# Patient Record
Sex: Male | Born: 1990 | Race: Black or African American | Hispanic: No | Marital: Single | Smoking: Current every day smoker
Health system: Southern US, Community
[De-identification: ages and names within clinical notes are randomized; demographics above are authoritative.]

## PROBLEM LIST (undated history)

## (undated) DIAGNOSIS — R634 Abnormal weight loss: Secondary | ICD-10-CM

## (undated) DIAGNOSIS — E079 Disorder of thyroid, unspecified: Secondary | ICD-10-CM

## (undated) DIAGNOSIS — F419 Anxiety disorder, unspecified: Secondary | ICD-10-CM

## (undated) HISTORY — PX: NO PAST SURGERIES: SHX2092

---
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
## 2011-05-07 LAB — GLUCOSE, CAPILLARY: Glucose-Capillary: 94

## 2011-09-10 ENCOUNTER — Encounter (HOSPITAL_COMMUNITY): Payer: Self-pay | Admitting: *Deleted

## 2011-09-10 ENCOUNTER — Emergency Department (INDEPENDENT_AMBULATORY_CARE_PROVIDER_SITE_OTHER)
Admission: EM | Admit: 2011-09-10 | Discharge: 2011-09-10 | Disposition: A | Discharge status: Home / Self Care | Payer: Self-pay | Source: Home / Self Care | Attending: Family Medicine | Admitting: Family Medicine

## 2011-09-10 DIAGNOSIS — M67929 Unspecified disorder of synovium and tendon, unspecified upper arm: Secondary | ICD-10-CM

## 2011-09-10 DIAGNOSIS — M6790 Unspecified disorder of synovium and tendon, unspecified site: Secondary | ICD-10-CM

## 2011-09-10 LAB — URIC ACID: Uric Acid, Serum: 4.8 mg/dL (ref 4.0–7.8)

## 2011-09-10 MED ORDER — HYDROCODONE-ACETAMINOPHEN 5-325 MG PO TABS: ORAL_TABLET | ORAL | Status: DC

## 2011-09-10 MED ORDER — NAPROXEN 500 MG PO TABS: 500.0000 mg | ORAL_TABLET | Freq: Two times a day (BID) | ORAL | Status: AC

## 2011-09-10 NOTE — ED Notes (Signed)
Pt reports left elbow pain x 2 days.  He denies any known injury, but regularly lifts objects up to 25 lbs at work.    Swelling noted above left elbow

## 2011-09-10 NOTE — ED Provider Notes (Signed)
History     CSN: [id number]  Arrival date & time 09/10/11  1234   First MD Initiated Contact with Patient 09/10/11 1503      Chief Complaint  Patient presents with  . Arm Pain    (Consider location/radiation/quality/duration/timing/severity/associated sxs/prior treatment) HPI Comments: Christian Pitts presents for evaluation of sudden onset of left elbow pain on the previous Saturday. He denies any injury to the arm. He states he does a lot of lifting of heavy objects at his workplace. But he has not worked since Thursday. Over the weekend, he was just keeping his little daughter. He denies any history of gout. He denies any other medical problems. He refuses to bend his arm now, secondary to pain. Nor does he completely extend it straight.  Patient is a 21 y.o. male presenting with arm pain. The history is provided by the patient.  Arm Pain This is a new problem. The current episode started more than 2 days ago. The problem occurs constantly. The problem has not changed since onset.The symptoms are aggravated by bending. He has tried nothing for the symptoms.    History reviewed. No pertinent past medical history.  History reviewed. No pertinent past surgical history.  History reviewed. No pertinent family history.  History  Substance Use Topics  . Smoking status: Former Smoker    Quit date: 09/09/2010  . Smokeless tobacco: Not on file  . Alcohol Use: No      Review of Systems  Constitutional: Negative.   HENT: Negative.   Eyes: Negative.   Respiratory: Negative.   Cardiovascular: Negative.   Gastrointestinal: Negative.   Genitourinary: Negative.   Musculoskeletal: Positive for arthralgias. Negative for joint swelling.  Skin: Negative.   Neurological: Negative.     Allergies  Review of patient's allergies indicates no known allergies.  Home Medications   Current Outpatient Rx  Name Route Sig Dispense Refill  . HYDROCODONE-ACETAMINOPHEN 5-325 MG PO TABS  Take one  to two tablets every 4 to 6 hours as needed for pain 10 tablet 0  . NAPROXEN 500 MG PO TABS Oral Take 1 tablet (500 mg total) by mouth 2 (two) times daily. 30 tablet 0    BP 135/68  Pulse 68  Temp(Src) 98.1 F (36.7 C) (Oral)  Resp 18  SpO2 100%  Physical Exam  Nursing note and vitals reviewed. Constitutional: He is oriented to person, place, and time. He appears well-developed and well-nourished.  HENT:  Head: Normocephalic and atraumatic.  Eyes: EOM are normal.  Neck: Normal range of motion.  Pulmonary/Chest: Effort normal.  Musculoskeletal:       Left elbow: He exhibits decreased range of motion. He exhibits no swelling, no effusion, no deformity and no laceration. tenderness found. Olecranon process tenderness noted.       Arms: Neurological: He is alert and oriented to person, place, and time.  Skin: Skin is warm and dry.  Psychiatric: His behavior is normal.    ED Course  Procedures (including critical care time)   Labs Reviewed  URIC ACID   No results found.   1. Tendinopathy of elbow       MDM  Given rx for naproxen 500 mg PO BID and hydrocodone PRN; re-evaluate in 48 hours        Richardo Priest, MD 09/10/11 1656

## 2011-09-12 ENCOUNTER — Encounter (HOSPITAL_COMMUNITY): Payer: Self-pay | Admitting: Emergency Medicine

## 2011-09-12 ENCOUNTER — Emergency Department (INDEPENDENT_AMBULATORY_CARE_PROVIDER_SITE_OTHER)
Admission: EM | Admit: 2011-09-12 | Discharge: 2011-09-12 | Disposition: A | Discharge status: Home / Self Care | Payer: Self-pay | Source: Home / Self Care

## 2011-09-12 DIAGNOSIS — M25529 Pain in unspecified elbow: Secondary | ICD-10-CM

## 2011-09-12 DIAGNOSIS — M25522 Pain in left elbow: Secondary | ICD-10-CM

## 2011-09-12 MED ORDER — TRAMADOL HCL 50 MG PO TABS: 50.0000 mg | ORAL_TABLET | Freq: Four times a day (QID) | ORAL | Status: AC | PRN

## 2011-09-12 NOTE — ED Notes (Signed)
Patient reports continued pain in left elbow.  Patient reports a decrease in swelling and able to bend elbow more than initially.  Patient reports he continues to have pain.  Denies injury.  Onset of pain started Saturday.  Seen Tuesday at the urgent care .

## 2011-09-12 NOTE — ED Provider Notes (Signed)
History     CSN: [id number]  Arrival date & time 09/12/11  1423   None     Chief Complaint  Patient presents with  . Elbow Pain    (Consider location/radiation/quality/duration/timing/severity/associated sxs/prior treatment) HPI Comments: Pt presents today for recheck of Lt elbow discomfort. He states that overall the elbow is improving. He is noticing less pain and swelling, but continues to have discomfort and is worse with flexion and lifting. He is taking Naprosyn as prescribed and hydrocodone for pain as needed but has run out. He has had no injury. His job is physical requiring heavy lifting. He did not go to work again today, is scheduled off tomorrow, then is to work Sat and Sunday.     History reviewed. No pertinent past medical history.  History reviewed. No pertinent past surgical history.  History reviewed. No pertinent family history.  History  Substance Use Topics  . Smoking status: Former Smoker    Quit date: 09/09/2010  . Smokeless tobacco: Not on file  . Alcohol Use: No      Review of Systems  Constitutional: Negative for fever and chills.  Musculoskeletal: Positive for joint swelling.  Skin: Negative for color change and rash.  Neurological: Negative for numbness.    Allergies  Review of patient's allergies indicates no known allergies.  Home Medications   Current Outpatient Rx  Name Route Sig Dispense Refill  . NAPROXEN 500 MG PO TABS Oral Take 1 tablet (500 mg total) by mouth 2 (two) times daily. 30 tablet 0  . TRAMADOL HCL 50 MG PO TABS Oral Take 1 tablet (50 mg total) by mouth every 6 (six) hours as needed for pain. 12 tablet 0    BP 156/80  Pulse 82  Temp(Src) 98.5 F (36.9 C) (Oral)  Resp 18  SpO2 99%  Physical Exam  Nursing note and vitals reviewed. Constitutional: He appears well-developed and well-nourished. No distress.  HENT:  Head: Normocephalic and atraumatic.  Cardiovascular:  Pulses:      Radial pulses are 2+ on the  left side.  Musculoskeletal:       Left elbow: He exhibits decreased range of motion (decreased flexion to 90 degrees and extension to 160 degrees) and swelling (dorsal elbow, generalized, not localized to olecranon bursa). He exhibits no effusion, no deformity and no laceration. tenderness (TTP superior to olecranon process) found. No radial head, no medial epicondyle, no lateral epicondyle and no olecranon process tenderness noted.  Neurological: He is alert.  Skin: Skin is warm and dry. No erythema.  Psychiatric: He has a normal mood and affect.    ED Course  Procedures (including critical care time)  Labs Reviewed - No data to display No results found.   1. Left elbow pain       MDM  Pt returned for 48 hr recheck of Lt elbow pain with some improvement but pain and swelling persist. Advised pt to continue current treatment plan. Begin icing elbow. If symptoms persist in one week will need ortho f/u.         Melody Comas, [place] 09/12/11 [phone]

## 2011-09-14 NOTE — ED Provider Notes (Signed)
Medical screening examination/treatment/procedure(s) were performed by non-physician practitioner and as supervising physician I was immediately available for consultation/collaboration.  Luiz Blare MD   Luiz Blare, MD 09/14/11 [phone]

## 2013-03-03 ENCOUNTER — Encounter (HOSPITAL_COMMUNITY): Payer: Self-pay | Admitting: *Deleted

## 2013-03-03 ENCOUNTER — Emergency Department (HOSPITAL_COMMUNITY): Payer: Self-pay

## 2013-03-03 ENCOUNTER — Emergency Department (HOSPITAL_COMMUNITY)
Admission: EM | Admit: 2013-03-03 | Discharge: 2013-03-04 | Disposition: A | Discharge status: Home / Self Care | Payer: Self-pay | Attending: Emergency Medicine | Admitting: Emergency Medicine

## 2013-03-03 DIAGNOSIS — F411 Generalized anxiety disorder: Secondary | ICD-10-CM | POA: Insufficient documentation

## 2013-03-03 DIAGNOSIS — R0789 Other chest pain: Secondary | ICD-10-CM | POA: Insufficient documentation

## 2013-03-03 DIAGNOSIS — Z87891 Personal history of nicotine dependence: Secondary | ICD-10-CM | POA: Insufficient documentation

## 2013-03-03 DIAGNOSIS — R61 Generalized hyperhidrosis: Secondary | ICD-10-CM | POA: Insufficient documentation

## 2013-03-03 DIAGNOSIS — R51 Headache: Secondary | ICD-10-CM | POA: Insufficient documentation

## 2013-03-03 DIAGNOSIS — R0602 Shortness of breath: Secondary | ICD-10-CM | POA: Insufficient documentation

## 2013-03-03 LAB — URINALYSIS, ROUTINE W REFLEX MICROSCOPIC
Glucose, UA: NEGATIVE mg/dL
Hgb urine dipstick: NEGATIVE
Ketones, ur: NEGATIVE mg/dL
Protein, ur: NEGATIVE mg/dL
Urobilinogen, UA: 1 mg/dL (ref 0.0–1.0)

## 2013-03-03 LAB — POCT I-STAT TROPONIN I: Troponin i, poc: 0.01 ng/mL (ref 0.00–0.08)

## 2013-03-03 LAB — CBC WITH DIFFERENTIAL/PLATELET
Eosinophils Absolute: 0.1 10*3/uL (ref 0.0–0.7)
Eosinophils Relative: 1 % (ref 0–5)
Lymphs Abs: 1.9 10*3/uL (ref 0.7–4.0)
MCH: 24.3 pg — ABNORMAL LOW (ref 26.0–34.0)
MCHC: 33.4 g/dL (ref 30.0–36.0)
MCV: 72.8 fL — ABNORMAL LOW (ref 78.0–100.0)
Monocytes Absolute: 0.6 10*3/uL (ref 0.1–1.0)
Neutrophils Relative %: 52 % (ref 43–77)
Platelets: 197 10*3/uL (ref 150–400)
RBC: 5.3 MIL/uL (ref 4.22–5.81)
RDW: 14 % (ref 11.5–15.5)

## 2013-03-03 LAB — BASIC METABOLIC PANEL
CO2: 27 mEq/L (ref 19–32)
Calcium: 9.4 mg/dL (ref 8.4–10.5)
Creatinine, Ser: 0.66 mg/dL (ref 0.50–1.35)
GFR calc non Af Amer: >90 mL/min (ref >90)

## 2013-03-03 LAB — RAPID URINE DRUG SCREEN, HOSP PERFORMED
Amphetamines: NOT DETECTED
Tetrahydrocannabinol: POSITIVE — AB

## 2013-03-03 MED ORDER — ASPIRIN 81 MG PO CHEW
324.0000 mg | CHEWABLE_TABLET | Freq: Once | ORAL | Status: AC
  Administered 2013-03-03: 324 mg via ORAL
  Filled 2013-03-03: qty 4

## 2013-03-03 MED ORDER — MORPHINE SULFATE 4 MG/ML IJ SOLN
4.0000 mg | Freq: Once | INTRAMUSCULAR | Status: AC
  Administered 2013-03-03: 4 mg via INTRAVENOUS
  Filled 2013-03-03: qty 1

## 2013-03-03 MED ORDER — SODIUM CHLORIDE 0.9 % IV BOLUS (SEPSIS)
1000.0000 mL | Freq: Once | INTRAVENOUS | Status: AC
  Administered 2013-03-03: 1000 mL via INTRAVENOUS

## 2013-03-03 MED ORDER — LORAZEPAM 2 MG/ML IJ SOLN
INTRAMUSCULAR | Status: AC
  Filled 2013-03-03: qty 1

## 2013-03-03 MED ORDER — LORAZEPAM 2 MG/ML IJ SOLN
2.0000 mg | Freq: Once | INTRAMUSCULAR | Status: AC
  Administered 2013-03-03: 2 mg via INTRAVENOUS

## 2013-03-03 NOTE — ED Provider Notes (Addendum)
CSN: [id number]     Arrival date & time 03/03/13  1922 History     First MD Initiated Contact with Patient 03/03/13 1948     Chief Complaint  Patient presents with  . Chest Pain   (Consider location/radiation/quality/duration/timing/severity/associated sxs/prior Treatment) Patient is a 22 y.o. male presenting with chest pain. The history is provided by the patient.  Chest Pain Pain location:  Substernal area Pain quality: tightness   Pain radiates to:  Does not radiate Pain severity:  Severe Onset quality:  Sudden Duration:  1 hour Timing:  Constant Progression:  Unchanged Chronicity:  New Context comment:  Smoked a cigarette, then went to lay down, then CP started.   Relieved by:  Nothing Worsened by:  Nothing tried Associated symptoms: diaphoresis, headache and shortness of breath   Associated symptoms: no abdominal pain, no cough, no fever, no nausea and not vomiting   Associated symptoms comment:  Generalized jerking   History reviewed. No pertinent past medical history. History reviewed. No pertinent past surgical history. No family history on file. History  Substance Use Topics  . Smoking status: Former Smoker    Quit date: 09/09/2010  . Smokeless tobacco: Not on file  . Alcohol Use: No    Review of Systems  Constitutional: Positive for diaphoresis. Negative for fever.  HENT: Negative for congestion.   Respiratory: Positive for shortness of breath. Negative for cough.   Cardiovascular: Positive for chest pain.  Gastrointestinal: Negative for nausea, vomiting, abdominal pain and diarrhea.  Neurological: Positive for headaches.  All other systems reviewed and are negative.    Allergies  Review of patient's allergies indicates no known allergies.  Home Medications  No current outpatient prescriptions on file. BP 167/77  Pulse 119  Temp(Src) 98.1 F (36.7 C) (Oral)  Resp 21  SpO2 99% Physical Exam  Nursing note and vitals reviewed. Constitutional: He  is oriented to person, place, and time. He appears well-developed and well-nourished. He appears distressed.  HENT:  Head: Normocephalic and atraumatic.  Mouth/Throat: Oropharynx is clear and moist.  Eyes: Conjunctivae are normal. Pupils are equal, round, and reactive to light. No scleral icterus.  Neck: Neck supple.  Cardiovascular: Regular rhythm, normal heart sounds and intact distal pulses.  Tachycardia present.   No murmur heard. Pulmonary/Chest: Effort normal and breath sounds normal. No stridor. No respiratory distress. He has no wheezes. He has no rales.  Abdominal: Soft. He exhibits no distension. There is no tenderness. There is no rebound and no guarding.  Musculoskeletal: Normal range of motion. He exhibits no edema.  Neurological: He is alert and oriented to person, place, and time.  Generalized jerking movements in all extremities  Skin: Skin is warm. No rash noted. He is diaphoretic.  Psychiatric: He has a normal mood and affect. His behavior is normal.    ED Course   Procedures (including critical care time)  Labs Reviewed  CBC WITH DIFFERENTIAL - Abnormal; Notable for the following:    Hemoglobin 12.9 (*)    HCT 38.6 (*)    MCV 72.8 (*)    MCH 24.3 (*)    All other components within normal limits  URINALYSIS, ROUTINE W REFLEX MICROSCOPIC - Abnormal; Notable for the following:    Color, Urine AMBER (*)    APPearance CLOUDY (*)    All other components within normal limits  URINE RAPID DRUG SCREEN (HOSP PERFORMED) - Abnormal; Notable for the following:    Tetrahydrocannabinol POSITIVE (*)    All other components within  normal limits  BASIC METABOLIC PANEL  POCT I-STAT TROPONIN I  POCT I-STAT TROPONIN I   Dg Chest Port 1 View  03/03/2013   *RADIOLOGY REPORT*  Clinical Data: Pain, headaches, shortness of breath, and dizziness.  PORTABLE CHEST - 1 VIEW  Comparison: 06/10/2007  Findings: Heart size and pulmonary vascularity are normal and the lungs are clear.  No  osseous abnormality.  IMPRESSION: Normal chest.   Original Report Authenticated By: Francene Boyers, M.D.  All radiology studies independently viewed by me.     EKG - Baseline artifact. Sinus tachy, rate 121, normal axis, QTc 496, nonspecific T wave abnormalites in lateral leads, no priors.  EKG #2 - sinus tachy, rate 108, PR 234, normal axis, nonspecific ST/T changes, similar to prior.   1. Anxiety reaction     MDM  22 yo male presenting with chest pain, SOB, diaphoresis, and generalized jerking.  Appears very anxious.  Started shortly after smoking a cigarette which he states was a normal cigarette.  EMS gave ASA.  EKG showed tachycardia with some nonspecific changes with significant artifact.  IV ativan and morphine given.  Will repeat EKG and check labs.  Possibly acute anxiety reaction vs drug ingestion.   9:05 PM Appears much better.  States his pain and SOB have near resolved.  Still slightly tachycardic.  No longer diaphoretic and shaking.    12:07 AM Observed in ED with no return in his symptoms.  States he feels normal now.  Repeat EKG showed some nonspecific changes.  I discussed these with Cardiology who felt that they were most consistent with early repolarization.  He is extremely low risk.  I feel that his symptoms are best explained by an anxiety reaction. He endorses being under increased stress recently, which may have instigated his symptoms.  DC'd home with return precautions and PCP followup.     Candyce Churn, MD 03/04/13 0454  Candyce Churn, MD 03/04/13 [phone]

## 2013-03-03 NOTE — ED Notes (Signed)
PT now sitting up in bed texting on cell phone. Pt calm, no sweating and laughing with laughing with family.

## 2013-03-03 NOTE — ED Notes (Signed)
Pt aware urine is needed. Given a urinal but unable to void at this time.

## 2013-03-03 NOTE — ED Notes (Signed)
Pt reported lying down after smoking a Cig. Tonight and center CP pressure type. CP does not radiate. EMS staff gave 324 ASA and 2 nitro no relief of CP

## 2013-03-03 NOTE — ED Notes (Signed)
EKG was shot and signed by Dr. Loretha Stapler

## 2013-03-03 NOTE — ED Notes (Signed)
On assessment Pt's body ridged and shaking. Pt sweating and denies pain.

## 2013-08-01 ENCOUNTER — Encounter (HOSPITAL_COMMUNITY): Payer: Self-pay | Admitting: Emergency Medicine

## 2013-08-01 ENCOUNTER — Emergency Department (HOSPITAL_COMMUNITY)
Admission: EM | Admit: 2013-08-01 | Discharge: 2013-08-01 | Disposition: A | Discharge status: Home / Self Care | Payer: Self-pay | Attending: Emergency Medicine | Admitting: Emergency Medicine

## 2013-08-01 ENCOUNTER — Other Ambulatory Visit: Payer: Self-pay

## 2013-08-01 DIAGNOSIS — F17200 Nicotine dependence, unspecified, uncomplicated: Secondary | ICD-10-CM | POA: Insufficient documentation

## 2013-08-01 DIAGNOSIS — G479 Sleep disorder, unspecified: Secondary | ICD-10-CM | POA: Insufficient documentation

## 2013-08-01 DIAGNOSIS — F411 Generalized anxiety disorder: Secondary | ICD-10-CM | POA: Insufficient documentation

## 2013-08-01 DIAGNOSIS — R000 Tachycardia, unspecified: Secondary | ICD-10-CM | POA: Insufficient documentation

## 2013-08-01 DIAGNOSIS — R197 Diarrhea, unspecified: Secondary | ICD-10-CM | POA: Insufficient documentation

## 2013-08-01 DIAGNOSIS — R21 Rash and other nonspecific skin eruption: Secondary | ICD-10-CM | POA: Insufficient documentation

## 2013-08-01 HISTORY — DX: Anxiety disorder, unspecified: F41.9

## 2013-08-01 MED ORDER — DIPHENHYDRAMINE HCL 25 MG PO TABS: 25.0000 mg | ORAL_TABLET | Freq: Four times a day (QID) | ORAL | Status: DC

## 2013-08-01 MED ORDER — DIPHENHYDRAMINE HCL 25 MG PO CAPS: 25.0000 mg | ORAL_CAPSULE | Freq: Once | ORAL | Status: DC

## 2013-08-01 NOTE — ED Provider Notes (Signed)
CSN: [id number]     Arrival date & time 08/01/13  1549 History  This chart was scribed for Christian Helper, PA-C, working with Junius Argyle, MD by Blanchard Kelch, ED Scribe. This patient was seen in room TR06C/TR06C and the patient's care was started at 6:22 PM.    Chief Complaint  Patient presents with  . Anxiety  . Rash    Patient is a 22 y.o. male presenting with anxiety and rash. The history is provided by the patient. No language interpreter was used.  Anxiety  Rash Associated symptoms: diarrhea   Associated symptoms: no fever     HPI Comments: Christian Pitts is a 22 y.o. male who presents to the Emergency Department complaining of an intermittent itching rash on his forearms and lower legs that appeared two or three months ago. He first noticed the rash on his right forearm at the site of a tattoo. He denies any contacts at home with similar rash. He also reports recent abdominal discomfort after eating. He has diarrhea after eating for a few months that does not cause the abdominal discomfort to subside. He denies any environmental changes. He denies above baseline flatulence. He also reports having anxiety that began two months ago. He was brought here to the ER because he couldn't stop shaking. He states he was given morphine without relief. He states that his body shakes everyday due to anxiety. He also reports difficulty sleeping for about a year. He denies SI, HI or hallucinations. He is a current smoker. He denies regular alcohol use. He denies illegal drug use. He consumes caffeine about two times a week.  Past Medical History  Diagnosis Date  . Anxiety    History reviewed. No pertinent past surgical history. History reviewed. No pertinent family history. History  Substance Use Topics  . Smoking status: Current Every Day Smoker    Last Attempt to Quit: 09/09/2010  . Smokeless tobacco: Not on file  . Alcohol Use: Yes    Review of Systems  Constitutional: Negative  for fever.  Gastrointestinal: Positive for diarrhea.  Skin: Positive for rash.  Psychiatric/Behavioral: Positive for sleep disturbance. Negative for suicidal ideas. The patient is nervous/anxious.     Allergies  Review of patient's allergies indicates no known allergies.  Home Medications   Current Outpatient Rx  Name  Route  Sig  Dispense  Refill  . dibucaine (NUPERCAINAL) 1 % OINT   Rectal   Place 1 application rectally daily as needed (rash).          Triage Vitals; BP 160/69  Pulse 136  Temp(Src) 98.6 F (37 C) (Oral)  Resp 22  Wt 230 lb (104.327 kg)  SpO2 98%  Physical Exam  Nursing note and vitals reviewed. Constitutional: He is oriented to person, place, and time. He appears well-developed and well-nourished. No distress.  HENT:  Head: Normocephalic and atraumatic.  Eyes: EOM are normal.  Neck: Neck supple. No tracheal deviation present. No thyromegaly present.  Cardiovascular: Tachycardia present.  Exam reveals no gallop and no friction rub.   No murmur heard. Pulmonary/Chest: Effort normal. No respiratory distress. He has no wheezes. He has no rales.  Lungs clear to ascultation bilaterally.   Musculoskeletal: Normal range of motion.  Neurological: He is alert and oriented to person, place, and time.  Skin: Skin is warm and dry. Rash noted.  Few fine papular lesions noted to bilateral forearms and bilateral lower legs without erythema. No pustules, vesicular or petechial lesions noted.  Psychiatric: He has a normal mood and affect. His behavior is normal.    ED Course  Procedures (including critical care time)   Date: 08/01/2013  Rate: 119  Rhythm: sinus tachycardia  QRS Axis: normal  Intervals: normal  ST/T Wave abnormalities: normal  Conduction Disutrbances:none  Narrative Interpretation:   Old EKG Reviewed: none available    DIAGNOSTIC STUDIES: Oxygen Saturation is 98% on room air, normal by my interpretation.    COORDINATION OF CARE: 6:26  PM -Advise patient to follow up with PCP for potential thyroid issues. Will give patient resources to follow up. Patient verbalizes understanding and agrees with treatment plan.  7:00 PM Pt report feeling anxious.  Is tachycardic but no lightheadedness, dizziness.  ECG with sinus tach but without arrythmia.  I suspect thyroid dysfunction but since sxs ongoing for 2 months he will benefit from further management through PCP.  Resources given.  Benadryl for rash since rash without red flags.  Could be contact dermatitis however rash is faint and non concerning. Pt has sxs suggestive of IBS as well however no abd pain and no rectal bleeding concerning for IBD.  I do not think benzo is appropriate in this setting as pt denies panic attack.  Pt agrees to f/u with PCP.  Return precaution as needed.    Labs Review Labs Reviewed - No data to display Imaging Review No results found.  EKG Interpretation   None       MDM   1. Tachycardia   2. Rash    BP 160/69  Pulse 123  Temp(Src) 98.6 F (37 C) (Oral)  Resp 22  Wt 230 lb (104.327 kg)  SpO2 99%   I personally performed the services described in this documentation, which was scribed in my presence. The recorded information has been reviewed and is accurate.     Christian Helper, PA-C 08/01/13 1907

## 2013-08-01 NOTE — ED Notes (Signed)
Pt alert and oriented, with steady gait at time of discharge. Pt given discharge papers and papers explained. All questions answered and pt walked to discharge.  

## 2013-08-01 NOTE — ED Notes (Signed)
Pt c/o increasing anxiety x 3 months that was worse today; pt sts rash to right arm; pt sts feels jittery and seen for same and given meds for anxiety with effect but now out of meds

## 2013-08-01 NOTE — ED Notes (Signed)
Pt denies to pharmacy tech ever being prescribed any medication, hx does not suggest medication prescribed in the past

## 2013-08-02 NOTE — ED Provider Notes (Signed)
Medical screening examination/treatment/procedure(s) were performed by non-physician practitioner and as supervising physician I was immediately available for consultation/collaboration.  Date: 08/02/2013  Rate: 119  Rhythm: sinus tachycardia  QRS Axis: normal  Intervals: normal  ST/T Wave abnormalities: normal  Conduction Disutrbances:none  Narrative Interpretation: sinus tachycardia  Old EKG Reviewed: none available    Junius Argyle, MD 08/02/13 1145

## 2013-09-06 ENCOUNTER — Ambulatory Visit: Payer: Self-pay | Attending: Internal Medicine | Admitting: Internal Medicine

## 2013-09-06 ENCOUNTER — Encounter: Payer: Self-pay | Admitting: Internal Medicine

## 2013-09-06 VITALS — BP 139/78 | HR 134 | Temp 98.3°F | Resp 16 | Ht 73.0 in | Wt 220.0 lb

## 2013-09-06 DIAGNOSIS — F17200 Nicotine dependence, unspecified, uncomplicated: Secondary | ICD-10-CM | POA: Insufficient documentation

## 2013-09-06 DIAGNOSIS — R000 Tachycardia, unspecified: Secondary | ICD-10-CM | POA: Insufficient documentation

## 2013-09-06 LAB — COMPREHENSIVE METABOLIC PANEL
ALBUMIN: 4.2 g/dL (ref 3.5–5.2)
ALK PHOS: 119 U/L — AB (ref 39–117)
ALT: 21 U/L (ref 0–53)
AST: 20 U/L (ref 0–37)
BUN: 11 mg/dL (ref 6–23)
CALCIUM: 10.6 mg/dL — AB (ref 8.4–10.5)
CHLORIDE: 102 meq/L (ref 96–112)
CO2: 30 mEq/L (ref 19–32)
Creat: 0.5 mg/dL (ref 0.50–1.35)
Glucose, Bld: 101 mg/dL — ABNORMAL HIGH (ref 70–99)
POTASSIUM: 4.6 meq/L (ref 3.5–5.3)
SODIUM: 140 meq/L (ref 135–145)
TOTAL PROTEIN: 6.3 g/dL (ref 6.0–8.3)
Total Bilirubin: 0.7 mg/dL (ref 0.2–1.2)

## 2013-09-06 LAB — CBC WITH DIFFERENTIAL/PLATELET
BASOS ABS: 0 10*3/uL (ref 0.0–0.1)
BASOS PCT: 0 % (ref 0–1)
EOS ABS: 0.1 10*3/uL (ref 0.0–0.7)
Eosinophils Relative: 1 % (ref 0–5)
HCT: 40 % (ref 39.0–52.0)
Hemoglobin: 13.3 g/dL (ref 13.0–17.0)
Lymphocytes Relative: 32 % (ref 12–46)
Lymphs Abs: 2.3 10*3/uL (ref 0.7–4.0)
MCH: 23.4 pg — AB (ref 26.0–34.0)
MCHC: 33.3 g/dL (ref 30.0–36.0)
MCV: 70.4 fL — ABNORMAL LOW (ref 78.0–100.0)
MONOS PCT: 17 % — AB (ref 3–12)
Monocytes Absolute: 1.2 10*3/uL — ABNORMAL HIGH (ref 0.1–1.0)
NEUTROS ABS: 3.5 10*3/uL (ref 1.7–7.7)
NEUTROS PCT: 50 % (ref 43–77)
Platelets: 222 10*3/uL (ref 150–400)
RBC: 5.68 MIL/uL (ref 4.22–5.81)
RDW: 15.3 % (ref 11.5–15.5)
WBC: 7.1 10*3/uL (ref 4.0–10.5)

## 2013-09-06 LAB — TSH

## 2013-09-06 MED ORDER — METOPROLOL SUCCINATE ER 25 MG PO TB24: 25.0000 mg | ORAL_TABLET | Freq: Two times a day (BID) | ORAL | Status: DC

## 2013-09-06 NOTE — Patient Instructions (Signed)
Hyperthyroidism  The thyroid is a large gland located in the lower front part of your neck. The thyroid helps control metabolism. Metabolism is how your body uses food. It controls metabolism with the hormone thyroxine. When the thyroid is overactive, it produces too much hormone. When this happens, these following problems may occur:   · Nervousness  · Heat intolerance  · Weight loss (in spite of increase food intake)  · Diarrhea  · Change in hair or skin texture  · Palpitations (heart skipping or having extra beats)  · Tachycardia (rapid heart rate)  · Loss of menstruation (amenorrhea)  · Shaking of the hands  CAUSES  · Grave's Disease (the immune system attacks the thyroid gland). This is the most common cause.  · Inflammation of the thyroid gland.  · Tumor (usually benign) in the thyroid gland or elsewhere.  · Excessive use of thyroid medications (both prescription and 'natural').  · Excessive ingestion of Iodine.  DIAGNOSIS   To prove hyperthyroidism, your caregiver may do blood tests and ultrasound tests. Sometimes the signs are hidden. It may be necessary for your caregiver to watch this illness with blood tests, either before or after diagnosis and treatment.  TREATMENT  Short-term treatment  There are several treatments to control symptoms. Drugs called beta blockers may give some relief. Drugs that decrease hormone production will provide temporary relief in many people. These measures will usually not give permanent relief.  Definitive therapy  There are treatments available which can be discussed between you and your caregiver which will permanently treat the problem. These treatments range from surgery (removal of the thyroid), to the use of radioactive iodine (destroys the thyroid by radiation), to the use of antithyroid drugs (interfere with hormone synthesis). The first two treatments are permanent and usually successful. They most often require hormone replacement therapy for life. This is because  it is impossible to remove or destroy the exact amount of thyroid required to make a person euthyroid (normal).  HOME CARE INSTRUCTIONS   See your caregiver if the problems you are being treated for get worse. Examples of this would be the problems listed above.  SEEK MEDICAL CARE IF:  Your general condition worsens.  MAKE SURE YOU:   · Understand these instructions.  · Will watch your condition.  · Will get help right away if you are not doing well or get worse.  Document Released: 07/22/2005 Document Revised: 10/14/2011 Document Reviewed: 12/03/2006  [employer]® Patient Information ©2014 [employer].

## 2013-09-06 NOTE — Progress Notes (Signed)
Patient ID: Christian Pitts, male   DOB: [birth year], 23 y.o.   MRN: [id number]   CC: tachycardic  HPI: Pt is 23 yo male who presents to clinic with main concern of progressively worsening weakness, weigh loss over 80 lbs, found to have fast heart rate. He was treated in emergency department several days ago and was told to followup in primary care clinic for further evaluation. Patient denies chest pain or shortness of breath, no specific abdominal or urinary concerns.  No Known Allergies Past Medical History  Diagnosis Date  . Anxiety    Current Outpatient Prescriptions on File Prior to Visit  Medication Sig Dispense Refill  . dibucaine (NUPERCAINAL) 1 % OINT Place 1 application rectally daily as needed (rash).      . diphenhydrAMINE (BENADRYL) 25 MG tablet Take 1 tablet (25 mg total) by mouth every 6 (six) hours.  20 tablet  0   No current facility-administered medications on file prior to visit.   History reviewed. No pertinent family history. History   Social History  . Marital Status: Single    Spouse Name: N/A    Number of Children: N/A  . Years of Education: N/A   Occupational History  . Not on file.   Social History Main Topics  . Smoking status: Current Every Day Smoker    Last Attempt to Quit: 09/09/2010  . Smokeless tobacco: Not on file  . Alcohol Use: Yes  . Drug Use: No  . Sexual Activity: Not on file   Other Topics Concern  . Not on file   Social History Narrative  . No narrative on file    Review of Systems  Constitutional: Negative for fever, chills, diaphoresis, activity change.  HENT: Negative for ear pain, nosebleeds, congestion, facial swelling, rhinorrhea, neck pain, neck stiffness and ear discharge.   Eyes: Negative for pain, discharge, redness, itching and visual disturbance.  Respiratory: Negative for cough, choking, chest tightness, shortness of breath, wheezing and stridor.   Cardiovascular: Negative for chest pain, palpitations and leg  swelling.  Gastrointestinal: Negative for abdominal distention.  Genitourinary: Negative for dysuria, urgency, frequency, hematuria, flank pain, decreased urine volume, difficulty urinating and dyspareunia.  Musculoskeletal: Negative for back pain, joint swelling, arthralgias and gait problem.  Neurological: Negative for dizziness, tremors, seizures, syncope, facial asymmetry, speech difficulty, weakness, light-headedness, numbness and headaches.  Hematological: Negative for adenopathy. Does not bruise/bleed easily.  Psychiatric/Behavioral: Negative for hallucinations, behavioral problems, confusion, dysphoric mood, decreased concentration and agitation.    Objective:   Filed Vitals:   09/06/13 1008  BP: 139/78  Pulse: 134  Temp: 98.3 F (36.8 C)  Resp: 16    Physical Exam  Constitutional: Appears well-developed and well-nourished. No distress.  HENT: Normocephalic. External right and left ear normal. Oropharynx is clear and moist.  Eyes: Conjunctivae and EOM are normal. PERRLA, no scleral icterus.  bilateral exophthalmus noted Neck: Normal ROM. Neck supple. No JVD. No tracheal deviation. Enlarged thyroid gland, goiter noted, no tenderness on palpation CVS: Regular rhythm, tachycardic, S1/S2 +, no murmurs, no gallops, no carotid bruit.  Pulmonary: Effort and breath sounds normal, no stridor, rhonchi, wheezes, rales.  Abdominal: Soft. BS +,  no distension, tenderness, rebound or guarding.  Musculoskeletal: Normal range of motion. No edema and no tenderness.  Lymphadenopathy: No lymphadenopathy noted, cervical, inguinal. Neuro: Alert. Normal reflexes, muscle tone coordination. No cranial nerve deficit. Skin: Skin is warm and dry. No rash noted. Not diaphoretic. No erythema. No pallor.  Psychiatric: Normal mood and affect.  Behavior, judgment, thought content normal.   Lab Results  Component Value Date   WBC 5.3 03/03/2013   HGB 12.9* 03/03/2013   HCT 38.6* 03/03/2013   MCV 72.8*  03/03/2013   PLT 197 03/03/2013   Lab Results  Component Value Date   CREATININE 0.66 03/03/2013   BUN 9 03/03/2013   NA 142 03/03/2013   K 3.7 03/03/2013   CL 107 03/03/2013   CO2 27 03/03/2013    No results found for this basename: HGBA1C   Lipid Panel  No results found for this basename: chol, trig, hdl, cholhdl, vldl, ldlcalc       Assessment and plan:   Tachycardia - based on physical exam and symptoms this is mostly consistent with over functioning thyroid gland. We'll proceed with checking TSH, will also send patient for a thyroid ultrasound and echocardiogram. I will temporarily start patient on metoprolol 25 mg by mouth twice a day. I also told patient as soon as we get the results back of TSH will call him and we may need to ask him to come back for further evaluation. Patient also may need referral to endocrinologist based on the above results.

## 2013-09-06 NOTE — Progress Notes (Signed)
HFU Pt was admitted to the hospital with SOB, chest pain and tachycardia.

## 2013-09-10 ENCOUNTER — Ambulatory Visit (HOSPITAL_COMMUNITY): Payer: Self-pay | Attending: Internal Medicine

## 2013-09-10 ENCOUNTER — Ambulatory Visit (HOSPITAL_COMMUNITY): Payer: Self-pay

## 2013-10-01 ENCOUNTER — Ambulatory Visit: Payer: Self-pay

## 2013-10-05 ENCOUNTER — Encounter: Payer: Self-pay | Admitting: Emergency Medicine

## 2013-10-05 ENCOUNTER — Other Ambulatory Visit: Payer: Self-pay | Admitting: Emergency Medicine

## 2013-10-05 ENCOUNTER — Encounter: Payer: Self-pay | Admitting: Internal Medicine

## 2013-10-05 DIAGNOSIS — E0590 Thyrotoxicosis, unspecified without thyrotoxic crisis or storm: Secondary | ICD-10-CM

## 2013-10-05 MED ORDER — PROPRANOLOL HCL 10 MG PO TABS: 10.0000 mg | ORAL_TABLET | Freq: Three times a day (TID) | ORAL | Status: DC

## 2013-10-05 MED ORDER — LEVOTHYROXINE SODIUM 50 MCG PO TABS: 50.0000 ug | ORAL_TABLET | Freq: Every day | ORAL | Status: DC

## 2013-10-05 NOTE — Progress Notes (Signed)
Pt here to pick up papers regarding medication taking for tachycardic @ [facility]. Rechecked pt h/r 107,asymptomatic Blood work drawn Pt prescribed Levothyroxine 50 mcg daily,Propranolol 10 mg TID and to return in 2 weeks for nurse visit Endocrinology referral placed

## 2013-10-08 ENCOUNTER — Ambulatory Visit: Payer: Self-pay | Admitting: Endocrinology

## 2013-10-08 DIAGNOSIS — Z0289 Encounter for other administrative examinations: Secondary | ICD-10-CM

## 2013-10-13 ENCOUNTER — Ambulatory Visit: Payer: Self-pay

## 2013-11-10 ENCOUNTER — Ambulatory Visit: Payer: Self-pay

## 2013-11-10 ENCOUNTER — Ambulatory Visit: Payer: Self-pay | Attending: Internal Medicine | Admitting: Internal Medicine

## 2013-11-10 VITALS — BP 153/75 | HR 109 | Temp 98.3°F | Resp 16 | Ht 73.0 in | Wt 207.0 lb

## 2013-11-10 DIAGNOSIS — E0590 Thyrotoxicosis, unspecified without thyrotoxic crisis or storm: Secondary | ICD-10-CM | POA: Insufficient documentation

## 2013-11-10 DIAGNOSIS — F17200 Nicotine dependence, unspecified, uncomplicated: Secondary | ICD-10-CM | POA: Insufficient documentation

## 2013-11-10 DIAGNOSIS — R5383 Other fatigue: Secondary | ICD-10-CM

## 2013-11-10 DIAGNOSIS — R5381 Other malaise: Secondary | ICD-10-CM | POA: Insufficient documentation

## 2013-11-10 DIAGNOSIS — Z79899 Other long term (current) drug therapy: Secondary | ICD-10-CM | POA: Insufficient documentation

## 2013-11-10 DIAGNOSIS — R634 Abnormal weight loss: Secondary | ICD-10-CM | POA: Insufficient documentation

## 2013-11-10 DIAGNOSIS — F411 Generalized anxiety disorder: Secondary | ICD-10-CM | POA: Insufficient documentation

## 2013-11-10 LAB — CBC WITH DIFFERENTIAL/PLATELET
BASOS ABS: 0 10*3/uL (ref 0.0–0.1)
Basophils Relative: 0 % (ref 0–1)
Eosinophils Absolute: 0.1 10*3/uL (ref 0.0–0.7)
Eosinophils Relative: 2 % (ref 0–5)
HEMATOCRIT: 39.9 % (ref 39.0–52.0)
HEMOGLOBIN: 13.2 g/dL (ref 13.0–17.0)
LYMPHS ABS: 2.1 10*3/uL (ref 0.7–4.0)
LYMPHS PCT: 36 % (ref 12–46)
MCH: 23.4 pg — ABNORMAL LOW (ref 26.0–34.0)
MCHC: 33.1 g/dL (ref 30.0–36.0)
MCV: 70.6 fL — AB (ref 78.0–100.0)
MONO ABS: 0.9 10*3/uL (ref 0.1–1.0)
MONOS PCT: 15 % — AB (ref 3–12)
NEUTROS ABS: 2.8 10*3/uL (ref 1.7–7.7)
Neutrophils Relative %: 47 % (ref 43–77)
Platelets: 221 10*3/uL (ref 150–400)
RBC: 5.65 MIL/uL (ref 4.22–5.81)
RDW: 15.7 % — ABNORMAL HIGH (ref 11.5–15.5)
WBC: 5.9 10*3/uL (ref 4.0–10.5)

## 2013-11-10 LAB — COMPREHENSIVE METABOLIC PANEL
ALT: 20 U/L (ref 0–53)
AST: 20 U/L (ref 0–37)
Albumin: 4 g/dL (ref 3.5–5.2)
Alkaline Phosphatase: 136 U/L — ABNORMAL HIGH (ref 39–117)
BUN: 9 mg/dL (ref 6–23)
CO2: 28 meq/L (ref 19–32)
Calcium: 10.7 mg/dL — ABNORMAL HIGH (ref 8.4–10.5)
Chloride: 104 mEq/L (ref 96–112)
Creat: 0.5 mg/dL (ref 0.50–1.35)
GLUCOSE: 94 mg/dL (ref 70–99)
POTASSIUM: 4.7 meq/L (ref 3.5–5.3)
Sodium: 140 mEq/L (ref 135–145)
Total Bilirubin: 0.8 mg/dL (ref 0.2–1.2)
Total Protein: 6.2 g/dL (ref 6.0–8.3)

## 2013-11-10 MED ORDER — METOPROLOL TARTRATE 25 MG PO TABS: 25.0000 mg | ORAL_TABLET | Freq: Two times a day (BID) | ORAL | Status: DC

## 2013-11-10 NOTE — Progress Notes (Signed)
Pt states that for 2 weeks he has been having pain in his right eye. Now his eye is drooping and giving him a headache.

## 2013-11-10 NOTE — Patient Instructions (Signed)
Hyperthyroidism  The thyroid is a large gland located in the lower front part of your neck. The thyroid helps control metabolism. Metabolism is how your body uses food. It controls metabolism with the hormone thyroxine. When the thyroid is overactive, it produces too much hormone. When this happens, these following problems may occur:   · Nervousness  · Heat intolerance  · Weight loss (in spite of increase food intake)  · Diarrhea  · Change in hair or skin texture  · Palpitations (heart skipping or having extra beats)  · Tachycardia (rapid heart rate)  · Loss of menstruation (amenorrhea)  · Shaking of the hands  CAUSES  · Grave's Disease (the immune system attacks the thyroid gland). This is the most common cause.  · Inflammation of the thyroid gland.  · Tumor (usually benign) in the thyroid gland or elsewhere.  · Excessive use of thyroid medications (both prescription and 'natural').  · Excessive ingestion of Iodine.  DIAGNOSIS   To prove hyperthyroidism, your caregiver may do blood tests and ultrasound tests. Sometimes the signs are hidden. It may be necessary for your caregiver to watch this illness with blood tests, either before or after diagnosis and treatment.  TREATMENT  Short-term treatment  There are several treatments to control symptoms. Drugs called beta blockers may give some relief. Drugs that decrease hormone production will provide temporary relief in many people. These measures will usually not give permanent relief.  Definitive therapy  There are treatments available which can be discussed between you and your caregiver which will permanently treat the problem. These treatments range from surgery (removal of the thyroid), to the use of radioactive iodine (destroys the thyroid by radiation), to the use of antithyroid drugs (interfere with hormone synthesis). The first two treatments are permanent and usually successful. They most often require hormone replacement therapy for life. This is because  it is impossible to remove or destroy the exact amount of thyroid required to make a person euthyroid (normal).  HOME CARE INSTRUCTIONS   See your caregiver if the problems you are being treated for get worse. Examples of this would be the problems listed above.  SEEK MEDICAL CARE IF:  Your general condition worsens.  MAKE SURE YOU:   · Understand these instructions.  · Will watch your condition.  · Will get help right away if you are not doing well or get worse.  Document Released: 07/22/2005 Document Revised: 10/14/2011 Document Reviewed: 12/03/2006  [employer]® Patient Information ©2014 [employer].

## 2013-11-10 NOTE — Progress Notes (Signed)
Patient ID: Christian Pitts, male   DOB: [birth year], 23 y.o.   MRN: [id number]  CC: follow up   HPI: Pt is 23 yo male who comes in to the clinic with main concern of persistent fatigue, weight loss of 10 lbs over the past 3 months. Pt reports he was told he has hyperthyroidism and was taking medicine for it but now sure of the name and he ran out of it. He reports no chest pain or shortness of breath, no abdominal or urinary concerns. He is noticing protruding eyes and also enlarged gland around the neck area. He was told he needs to see endocrinologist but has not been able to afford the visitation.   No Known Allergies Past Medical History  Diagnosis Date  . Anxiety    Current Outpatient Prescriptions on File Prior to Visit  Medication Sig Dispense Refill  . dibucaine (NUPERCAINAL) 1 % OINT Place 1 application rectally daily as needed (rash).      . diphenhydrAMINE (BENADRYL) 25 MG tablet Take 1 tablet (25 mg total) by mouth every 6 (six) hours.  20 tablet  0   No current facility-administered medications on file prior to visit.   History reviewed. No pertinent family history. History   Social History  . Marital Status: Single    Spouse Name: N/A    Number of Children: N/A  . Years of Education: N/A   Occupational History  . Not on file.   Social History Main Topics  . Smoking status: Current Every Day Smoker    Last Attempt to Quit: 09/09/2010  . Smokeless tobacco: Not on file  . Alcohol Use: Yes  . Drug Use: No  . Sexual Activity: Not on file   Other Topics Concern  . Not on file   Social History Narrative  . No narrative on file    Review of Systems  Constitutional: Negative for fever, chills HENT: Negative for ear pain, nosebleeds, congestion, facial swelling, rhinorrhea, neck pain, neck stiffness and ear discharge.   Eyes: Negative for pain, discharge, redness, itching and visual disturbance.  Respiratory: Negative for cough, choking, chest tightness, shortness  of breath, wheezing and stridor.   Cardiovascular: Negative for chest pain, palpitations and leg swelling.  Gastrointestinal: Negative for abdominal distention.  Genitourinary: Negative for dysuria, urgency, frequency, hematuria, flank pain, decreased urine volume, difficulty urinating and dyspareunia.  Musculoskeletal: Negative for back pain, joint swelling, arthralgias and gait problem.  Neurological: Negative for syncope, facial asymmetry, speech difficulty, weakness, light-headedness, numbness and headaches.  Hematological: Does not bruise/bleed easily.  Psychiatric/Behavioral: Negative for hallucinations, behavioral problems, confusion, dysphoric mood, decreased concentration and agitation.    Objective:   Filed Vitals:   11/10/13 0902  BP: 153/75  Pulse: 109  Temp: 98.3 F (36.8 C)  Resp: 16    Physical Exam  Constitutional: Appears well-developed and well-nourished. No distress.  HENT: Normocephalic. External right and left ear normal. Oropharynx is clear and moist.  Eyes: Conjunctivae and EOM are normal. PERRLA, no scleral icterus. Exophthalmos very prominent with lid lag bilaterally  Neck: Normal ROM. Neck supple. No JVD. No tracheal deviation. Thyroid enlarged bilaterally with no specific nodules palpated.  CVS: Regular rhythm, tachycardic, S1/S2 +, no murmurs, no gallops, no carotid bruit.  Pulmonary: Effort and breath sounds normal, no stridor, rhonchi, wheezes, rales.  Abdominal: Soft. BS +,  no distension, tenderness, rebound or guarding.  Musculoskeletal: Normal range of motion. No edema and no tenderness.  Lymphadenopathy: No lymphadenopathy noted, cervical, inguinal. Neuro:  Alert. Normal reflexes, muscle tone coordination. No cranial nerve deficit. Skin: Skin is warm and dry. No rash noted. Not diaphoretic. No erythema. No pallor.  Psychiatric: Normal mood and affect. Behavior, judgment, thought content normal.   Lab Results  Component Value Date   WBC 7.1  09/06/2013   HGB 13.3 09/06/2013   HCT 40.0 09/06/2013   MCV 70.4* 09/06/2013   PLT 222 09/06/2013   Lab Results  Component Value Date   CREATININE 0.50 09/06/2013   BUN 11 09/06/2013   NA 140 09/06/2013   K 4.6 09/06/2013   CL 102 09/06/2013   CO2 30 09/06/2013    No results found for this basename: HGBA1C   Lipid Panel  No results found for this basename: chol, trig, hdl, cholhdl, vldl, ldlcalc       Assessment and plan:   Hyperthyroidism - will repeat TSH today and will ask for T4 as well - spoke with Dr Sharl MaKerr endocrinologist for further recommendations - thyroid uptake scan ordered stat - pt provided script for metoprolol for now and will start methimazole once thyroid uptake done - per Dr. Sharl MaKerr thyroid uptake must be done at least one week off methimazole but pt has never been on methimazole so we can do the scan first  - also per Dr. Sharl MaKerr recommend starting Methimazole if lover function stable, 60 mg PO QD  - will follow up on results  - pt to come back in one week

## 2013-11-11 LAB — T4: T4, Total: 26.6 ug/dL — ABNORMAL HIGH (ref 5.0–12.5)

## 2013-11-15 LAB — THYROID STIMULATING IMMUNOGLOBULIN: TSI: 551 %{baseline} — AB (ref <140)

## 2013-11-16 ENCOUNTER — Other Ambulatory Visit: Payer: Self-pay | Admitting: Internal Medicine

## 2013-11-16 ENCOUNTER — Telehealth: Payer: Self-pay | Admitting: Emergency Medicine

## 2013-11-16 NOTE — Telephone Encounter (Signed)
Spoke with pt in regards to lab results and referral to Endocrinology Pt states he needs to complete orange card information Pt needs scheduled thyroid scan and medication. Will route message to Dr. Izola Pricemyers

## 2013-11-17 ENCOUNTER — Telehealth: Payer: Self-pay | Admitting: Emergency Medicine

## 2013-11-17 DIAGNOSIS — E0590 Thyrotoxicosis, unspecified without thyrotoxic crisis or storm: Secondary | ICD-10-CM

## 2013-11-17 NOTE — Telephone Encounter (Signed)
Spoke with pt and instructed him to make sure he keeps his scheduled U/S appt and Thyroid Scan Uptake Sched[facility] 1st floor Radiology 4/21 1245 pm and 4/23-4/24 scheduled scan @ 1245 pm Pt verbalized understanding and also told to go directly to ER if develop CP,increased tremors

## 2013-11-23 ENCOUNTER — Ambulatory Visit (HOSPITAL_COMMUNITY): Admission: RE | Admit: 2013-11-23 | Payer: Self-pay | Source: Ambulatory Visit

## 2013-11-23 ENCOUNTER — Telehealth: Payer: Self-pay | Admitting: Internal Medicine

## 2013-11-23 NOTE — Telephone Encounter (Signed)
Pt. Needs documentation regarding medication taken and explanation of reason why pt. is unable to work.Marland Kitchen.Marland Kitchen.Please contact pt.

## 2013-11-25 ENCOUNTER — Ambulatory Visit (HOSPITAL_COMMUNITY): Payer: Self-pay

## 2013-11-26 ENCOUNTER — Encounter (HOSPITAL_COMMUNITY): Payer: Self-pay

## 2014-02-10 ENCOUNTER — Observation Stay (HOSPITAL_COMMUNITY)
Admission: EM | Admit: 2014-02-10 | Discharge: 2014-02-11 | Disposition: A | Discharge status: Home / Self Care | Payer: Self-pay | Attending: Internal Medicine | Admitting: Internal Medicine

## 2014-02-10 ENCOUNTER — Encounter (HOSPITAL_COMMUNITY): Payer: Self-pay | Admitting: Emergency Medicine

## 2014-02-10 DIAGNOSIS — F17200 Nicotine dependence, unspecified, uncomplicated: Secondary | ICD-10-CM | POA: Insufficient documentation

## 2014-02-10 DIAGNOSIS — I440 Atrioventricular block, first degree: Secondary | ICD-10-CM | POA: Insufficient documentation

## 2014-02-10 DIAGNOSIS — R000 Tachycardia, unspecified: Secondary | ICD-10-CM | POA: Diagnosis present

## 2014-02-10 DIAGNOSIS — E0590 Thyrotoxicosis, unspecified without thyrotoxic crisis or storm: Principal | ICD-10-CM | POA: Diagnosis present

## 2014-02-10 DIAGNOSIS — I498 Other specified cardiac arrhythmias: Secondary | ICD-10-CM | POA: Insufficient documentation

## 2014-02-10 HISTORY — DX: Abnormal weight loss: R63.4

## 2014-02-10 HISTORY — DX: Disorder of thyroid, unspecified: E07.9

## 2014-02-10 LAB — BASIC METABOLIC PANEL
Anion gap: 12 (ref 5–15)
BUN: 11 mg/dL (ref 6–23)
CALCIUM: 10.1 mg/dL (ref 8.4–10.5)
CHLORIDE: 102 meq/L (ref 96–112)
CO2: 24 meq/L (ref 19–32)
Creatinine, Ser: 0.5 mg/dL (ref 0.50–1.35)
GFR calc Af Amer: >90 mL/min (ref >90)
GFR calc non Af Amer: >90 mL/min (ref >90)
GLUCOSE: 107 mg/dL — AB (ref 70–99)
Potassium: 4.4 mEq/L (ref 3.7–5.3)
SODIUM: 138 meq/L (ref 137–147)

## 2014-02-10 LAB — CBC
HCT: 37.1 % — ABNORMAL LOW (ref 39.0–52.0)
HEMOGLOBIN: 12.4 g/dL — AB (ref 13.0–17.0)
MCH: 24 pg — AB (ref 26.0–34.0)
MCHC: 33.4 g/dL (ref 30.0–36.0)
MCV: 71.9 fL — ABNORMAL LOW (ref 78.0–100.0)
Platelets: 196 10*3/uL (ref 150–400)
RBC: 5.16 MIL/uL (ref 4.22–5.81)
RDW: 15.2 % (ref 11.5–15.5)
WBC: 4.4 10*3/uL (ref 4.0–10.5)

## 2014-02-10 LAB — HEMOGLOBIN A1C
HEMOGLOBIN A1C: 5.2 % (ref <5.7)
Mean Plasma Glucose: 103 mg/dL (ref <117)

## 2014-02-10 LAB — TSH: TSH: 0.009 u[IU]/mL — AB (ref 0.350–4.500)

## 2014-02-10 LAB — T4, FREE: Free T4: 6.57 ng/dL — ABNORMAL HIGH (ref 0.80–1.80)

## 2014-02-10 MED ORDER — ACETAMINOPHEN 650 MG RE SUPP: 650.0000 mg | Freq: Four times a day (QID) | RECTAL | Status: DC | PRN

## 2014-02-10 MED ORDER — METHIMAZOLE 5 MG PO TABS
5.0000 mg | ORAL_TABLET | Freq: Every day | ORAL | Status: DC
  Administered 2014-02-11: 5 mg via ORAL
  Filled 2014-02-10 (×2): qty 1

## 2014-02-10 MED ORDER — LORAZEPAM 2 MG/ML IJ SOLN
1.0000 mg | Freq: Once | INTRAMUSCULAR | Status: AC
  Administered 2014-02-10: 1 mg via INTRAVENOUS
  Filled 2014-02-10: qty 1

## 2014-02-10 MED ORDER — ONDANSETRON HCL 4 MG PO TABS: 4.0000 mg | ORAL_TABLET | Freq: Four times a day (QID) | ORAL | Status: DC | PRN

## 2014-02-10 MED ORDER — HYDROCODONE-ACETAMINOPHEN 5-325 MG PO TABS
1.0000 | ORAL_TABLET | ORAL | Status: DC | PRN
  Administered 2014-02-11: 2 via ORAL
  Filled 2014-02-10: qty 2

## 2014-02-10 MED ORDER — SODIUM CHLORIDE 0.9 % IV SOLN
1000.0000 mL | Freq: Once | INTRAVENOUS | Status: AC
  Administered 2014-02-10: 1000 mL via INTRAVENOUS

## 2014-02-10 MED ORDER — SODIUM CHLORIDE 0.9 % IJ SOLN
3.0000 mL | Freq: Two times a day (BID) | INTRAMUSCULAR | Status: DC
  Administered 2014-02-10: 3 mL via INTRAVENOUS

## 2014-02-10 MED ORDER — PROPRANOLOL HCL ER 60 MG PO CP24
60.0000 mg | ORAL_CAPSULE | Freq: Every day | ORAL | Status: DC
  Administered 2014-02-11: 60 mg via ORAL
  Filled 2014-02-10 (×2): qty 1

## 2014-02-10 MED ORDER — ACETAMINOPHEN 325 MG PO TABS: 650.0000 mg | ORAL_TABLET | Freq: Four times a day (QID) | ORAL | Status: DC | PRN

## 2014-02-10 MED ORDER — SODIUM CHLORIDE 0.9 % IV SOLN
1000.0000 mL | INTRAVENOUS | Status: DC
  Administered 2014-02-10 – 2014-02-11 (×2): 1000 mL via INTRAVENOUS

## 2014-02-10 MED ORDER — SODIUM CHLORIDE 0.9 % IV SOLN
INTRAVENOUS | Status: AC
  Administered 2014-02-10: 16:00:00 via INTRAVENOUS

## 2014-02-10 MED ORDER — ONDANSETRON HCL 4 MG/2ML IJ SOLN: 4.0000 mg | Freq: Four times a day (QID) | INTRAMUSCULAR | Status: DC | PRN

## 2014-02-10 NOTE — ED Notes (Addendum)
Pt reports generalized body aches x 2 months. Pt stated that he has a thyroid problem but was advised not to take prescribed by Dr. Lenise ArenaMeyers. Pt is currently not taking medications. Reports 120 lb weight loss over 1 year Stated that he occasional feels his heart race

## 2014-02-10 NOTE — Progress Notes (Signed)
Discussed admission status with Dr. Devine. 

## 2014-02-10 NOTE — ED Provider Notes (Signed)
CSN: [id number]     Arrival date & time 02/10/14  1113 History   First MD Initiated Contact with Patient 02/10/14 1241     Chief Complaint  Patient presents with  . Generalized Body Aches    2 months c/o discomfort      HPI Patient has a diagnosis of hyperparathyroidism.  His TSH was nearly undetectable back in February.  He was scheduled to do a ultrasound of his thyroid as well as an uptake scan however the patient seems to have been lost to followup.  It sounds that there are social issues regarding his orange card.  The patient wants treatment and wants to get better but is having difficulty managing the outpatient setting.  He presents the emergency department today because of increasing fatigue and generalized body aches.  He states he lost approximately 120 pounds over the past year.  He denies nausea or vomiting.  No urinary complaints.  No chest pain or shortness of breath.  Tachycardic on arrival   Past Medical History  Diagnosis Date  . Anxiety   . Weight decrease   . Thyroid disorder    History reviewed. No pertinent past surgical history. Family History  Problem Relation Age of Onset  . Thyroid disease Father   . Hypertension Father    History  Substance Use Topics  . Smoking status: Current Every Day Smoker  . Smokeless tobacco: Not on file  . Alcohol Use: Yes     Comment: occasionallty    Review of Systems  All other systems reviewed and are negative.     Allergies  Review of patient's allergies indicates no known allergies.  Home Medications   Prior to Admission medications   Not on File   BP 145/72  Pulse 107  Temp(Src) 97.8 F (36.6 C)  Resp 14  SpO2 100% Physical Exam  Nursing note and vitals reviewed. Constitutional: He is oriented to person, place, and time. He appears well-developed and well-nourished.  HENT:  Head: Normocephalic and atraumatic.  Eyes: EOM are normal.  Neck: Normal range of motion.  Cardiovascular: Normal rate, regular  rhythm, normal heart sounds and intact distal pulses.   Pulmonary/Chest: Effort normal and breath sounds normal. No respiratory distress.  Abdominal: Soft. He exhibits no distension. There is no tenderness.  Musculoskeletal: Normal range of motion.  Neurological: He is alert and oriented to person, place, and time.  Skin: Skin is warm and dry.  Psychiatric: He has a normal mood and affect. Judgment normal.    ED Course  Procedures (including critical care time) Labs Review Labs Reviewed  CBC - Abnormal; Notable for the following:    Hemoglobin 12.4 (*)    HCT 37.1 (*)    MCV 71.9 (*)    MCH 24.0 (*)    All other components within normal limits  BASIC METABOLIC PANEL - Abnormal; Notable for the following:    Glucose, Bld 107 (*)    All other components within normal limits  TSH  T4, FREE    Imaging Review No results found.   EKG Interpretation   Date/Time:  Thursday February 10 2014 12:57:22 EDT Ventricular Rate:  110 PR Interval:    QRS Duration: 97 QT Interval:  320 QTC Calculation: 433 R Axis:   99 Text Interpretation:  sinus tachycardia with first degree AV block  Borderline right axis deviation Borderline T abnormalities, diffuse leads  Borderline ST elevation, anterior leads Confirmed by Jorgen Wolfinger  MD, Czar Ysaguirre  ([id number]) on 02/10/2014 2:47:04  PM      MDM   Final diagnoses:  Hyperthyroidism   Patient be admitted for hyperthyroidism.  I think you'll benefit from inpatient workup and expedient and management of his hyperthyroid symptoms which been present for a very long time.    Lyanne CoKevin M Korine Winton, MD 02/10/14 [phone]

## 2014-02-10 NOTE — ED Notes (Signed)
Initial Contact - pt resting on stretcher with family at bedside, pt reports hx hyperthyroid and reports his complaints are chronic, however pt reports "i've been working and it's bad at work".  Pt reports feeling palpitations, feeling pain "from my head down", dizzy, nausea, SOB, n/t to extremities.  Skin PWD.  MAEI, ambulatory with steady gait.  ST noted on monitor, pt denies CP.  A+Ox4.  NAD.

## 2014-02-10 NOTE — Progress Notes (Signed)
[facility] CL provided pt with a list of primary care resources and [facility] [employer] [facility]. Patient has had several apt for [facility] but has been a "no show." Patient stated that enrolling into the program was a lot of work and you had to go to a lot of places to get paperwork. CL encouraged patient to follow through with [facility] and establish a pcp.

## 2014-02-10 NOTE — Progress Notes (Addendum)
Changed to obs status. [facility]

## 2014-02-10 NOTE — H&P (Signed)
[facility] History and Physical  Christian Pitts ZOX:[id number] DOB: [birth year] DOA: 02/10/2014  Referring physician: ER physician PCP: Doris Cheadle, MD   Chief Complaint: palpitations   HPI:  23 year old male with past medical history of hyperthyroidism, was seen in [facility] but was lost to follow up who now presents to [facility] ED 02/10/2014 with worsening fatigue, weakness, palpitations ongoing for some time now. No chest pain, no shortness of breath. No abdominal pain, no nausea or vomiting. No fevers or chills. No reports of blood in stool or urine.   In ED, BP was 133/89, HR 106-118, T max 97.9 F and O2 saturation 100%. Blood work was essentially unremarkable. The 12 lead EKG showed sinus tachycardia. He was admitted for further observation and evaluation of tachycardia.  Assessment & Plan    Active Problems:   Hyperthyroidism - TSH 0.009 on this admission - started propranolol 60 mg daily and methimazole 5 mg daily - the 12 lead EKG showed sinus tachycardia - monitor on telemetry - needs endo referral on discharge    Tachycardia, sinus - secondary to hyperthyroidism - started propranolol   DVT prophylaxis:  SCD's bialterally   Radiological Exams on Admission: No results found.  EKG: sinus tachycardia   Code Status: Full Family Communication: Plan of care discussed with the patient  Disposition Plan: Admit for further evaluation; observation status, telemetry unit   Manson Passey, MD  [facility] Pager [phone]  Review of Systems:  Constitutional: Negative for fever, chills and malaise/fatigue. Negative for diaphoresis.  HENT: Negative for hearing loss, ear pain, nosebleeds, congestion, sore throat, neck pain, tinnitus and ear discharge.   Eyes: Negative for blurred vision, double vision, photophobia, pain, discharge and redness.  Respiratory: Negative for cough, hemoptysis, sputum production, shortness of breath, wheezing and  stridor.   Cardiovascular: per HPI.  Gastrointestinal: Negative for nausea, vomiting and abdominal pain. Negative for heartburn, constipation, blood in stool and melena.  Genitourinary: Negative for dysuria, urgency, frequency, hematuria and flank pain.  Musculoskeletal: Negative for myalgias, back pain, joint pain and falls.  Skin: Negative for itching and rash.  Neurological: Negative for dizziness and weakness. Negative for tingling, tremors, sensory change, speech change, focal weakness, loss of consciousness and headaches.  Endo/Heme/Allergies: Negative for environmental allergies and polydipsia. Does not bruise/bleed easily.  Psychiatric/Behavioral: Negative for suicidal ideas. The patient is not nervous/anxious.      Past Medical History  Diagnosis Date  . Anxiety   . Weight decrease   . Thyroid disorder    History reviewed. No pertinent past surgical history. Social History:  reports that he has been smoking.  He does not have any smokeless tobacco history on file. He reports that he drinks alcohol. He reports that he does not use illicit drugs.  No Known Allergies  Family History:  Family History  Problem Relation Age of Onset  . Thyroid disease Father   . Hypertension Father      Prior to Admission medications   Not on File   Physical Exam: Filed Vitals:   02/10/14 1212 02/10/14 1446  BP: 155/77 145/72  Pulse: 118 107  Temp: 97.8 F (36.6 C)   Resp: 20 14  SpO2: 100% 100%    Physical Exam  Constitutional: Appears well-developed and well-nourished. No distress.  HENT: Normocephalic. No tonsillar erythema or exudates Eyes: Conjunctivae and EOM are normal. PERRLA, no scleral icterus.  Neck: Normal ROM. Neck supple. No JVD. No tracheal deviation. No thyromegaly.  CVS:  tachycardia, S1/S2 +, no murmurs, no gallops, no carotid bruit.  Pulmonary: Effort and breath sounds normal, no stridor, rhonchi, wheezes, rales.  Abdominal: Soft. BS +,  no distension,  tenderness, rebound or guarding.  Musculoskeletal: Normal range of motion. No edema and no tenderness.  Lymphadenopathy: No lymphadenopathy noted, cervical, inguinal. Neuro: Alert. Normal reflexes, muscle tone coordination. No focal neurologic deficits. Skin: Skin is warm and dry. No rash noted. Not diaphoretic. No erythema. No pallor.  Psychiatric: Normal mood and affect. Behavior, judgment, thought content normal.   Labs on Admission:  Basic Metabolic Panel:  Recent Labs Lab 02/10/14 1341  NA 138  K 4.4  CL 102  CO2 24  GLUCOSE 107*  BUN 11  CREATININE 0.50  CALCIUM 10.1   Liver Function Tests: No results found for this basename: AST, ALT, ALKPHOS, BILITOT, PROT, ALBUMIN,  in the last 168 hours No results found for this basename: LIPASE, AMYLASE,  in the last 168 hours No results found for this basename: AMMONIA,  in the last 168 hours CBC:  Recent Labs Lab 02/10/14 1341  WBC 4.4  HGB 12.4*  HCT 37.1*  MCV 71.9*  PLT 196   Cardiac Enzymes: No results found for this basename: CKTOTAL, CKMB, CKMBINDEX, TROPONINI,  in the last 168 hours BNP: No components found with this basename: POCBNP,  CBG: No results found for this basename: GLUCAP,  in the last 168 hours  If 7PM-7AM, please contact night-coverage [email or web address] Password [email or web address] 02/10/2014, 3:09 PM

## 2014-02-11 LAB — COMPREHENSIVE METABOLIC PANEL
ALK PHOS: 148 U/L — AB (ref 39–117)
ALT: 18 U/L (ref 0–53)
ANION GAP: 10 (ref 5–15)
AST: 18 U/L (ref 0–37)
Albumin: 3.4 g/dL — ABNORMAL LOW (ref 3.5–5.2)
BUN: 8 mg/dL (ref 6–23)
CALCIUM: 10 mg/dL (ref 8.4–10.5)
CO2: 27 mEq/L (ref 19–32)
Chloride: 103 mEq/L (ref 96–112)
Creatinine, Ser: 0.55 mg/dL (ref 0.50–1.35)
GFR calc Af Amer: >90 mL/min (ref >90)
GFR calc non Af Amer: >90 mL/min (ref >90)
Glucose, Bld: 87 mg/dL (ref 70–99)
Potassium: 4.3 mEq/L (ref 3.7–5.3)
Sodium: 140 mEq/L (ref 137–147)
TOTAL PROTEIN: 5.8 g/dL — AB (ref 6.0–8.3)
Total Bilirubin: 0.6 mg/dL (ref 0.3–1.2)

## 2014-02-11 LAB — CBC
HEMATOCRIT: 36.4 % — AB (ref 39.0–52.0)
HEMOGLOBIN: 11.8 g/dL — AB (ref 13.0–17.0)
MCH: 23.8 pg — ABNORMAL LOW (ref 26.0–34.0)
MCHC: 32.4 g/dL (ref 30.0–36.0)
MCV: 73.5 fL — ABNORMAL LOW (ref 78.0–100.0)
Platelets: 162 10*3/uL (ref 150–400)
RBC: 4.95 MIL/uL (ref 4.22–5.81)
RDW: 15.2 % (ref 11.5–15.5)
WBC: 5.2 10*3/uL (ref 4.0–10.5)

## 2014-02-11 LAB — GLUCOSE, CAPILLARY: Glucose-Capillary: 130 mg/dL — ABNORMAL HIGH (ref 70–99)

## 2014-02-11 MED ORDER — ZOLPIDEM TARTRATE 5 MG PO TABS
5.0000 mg | ORAL_TABLET | Freq: Every evening | ORAL | Status: DC | PRN
  Administered 2014-02-11: 5 mg via ORAL
  Filled 2014-02-11: qty 1

## 2014-02-11 MED ORDER — ATENOLOL 50 MG PO TABS: 50.0000 mg | ORAL_TABLET | Freq: Every day | ORAL | Status: DC

## 2014-02-11 MED ORDER — METHIMAZOLE 10 MG PO TABS: 10.0000 mg | ORAL_TABLET | Freq: Three times a day (TID) | ORAL | Status: DC

## 2014-02-11 NOTE — Progress Notes (Signed)
UR Completed Alvah Gilder Graves-Bigelow, RN,BSN [phone]  

## 2014-02-11 NOTE — Care Management Note (Addendum)
    Page 1 of 2   02/11/2014     2:47:51 PM CARE MANAGEMENT NOTE 02/11/2014  Patient:  Christian Pitts,Christian Pitts   Account Number:  [id number]  Date Initiated:  02/11/2014  Documentation initiated by:  Christian Pitts,Christian Pitts  Subjective/Objective Assessment:   23 Y/O M ADMITTED W/HYPERTHYROIDISM.     Action/Plan:   [facility].HAS NOT GONE TO PCP FOR F/U.   Anticipated DC Date:  02/11/2014   Anticipated DC Plan:  HOME/SELF CARE  In-house referral  Financial Counselor      DC Planning Services  Medication Assistance  [facility]igent [facility]      Choice offered to / List presented to:             Status of service:  Completed, signed off Medicare Important Message given?   (If response is "NO", the following Medicare IM given date fields will be blank) Date Medicare IM given:   Medicare IM given by:   Date Additional Medicare IM given:   Additional Medicare IM given by:    Discharge Disposition:  HOME/SELF CARE  Per UR Regulation:  Reviewed for med. necessity/level of care/duration of stay  If discussed at Long Length of Stay Meetings, dates discussed:    Comments:  02/11/14 Christian Pitts Christian Pitts 2:45P-CALLED PATIENT TO CONFIRM HE WENT TO [facility],Christian Pitts(GIRLFRIEND) ANSWERED PHONE,STATES HE DIDN'T GO TO THE [facility] Christian Pitts,HE IS WAITING ON HIS DAD TO GIVE HIM THE MONEY,REMINDED THAT CLINIC CLOSES @ 6P TODAY,OPEN M-F 10A-6P,ALSO REMINDED THAT HE MUST WALK IN M-F 10A-6P FOR PCP F/U VISIT.SHE VOICED UNDERSTANDING.Christian Pitts W/UPDATE.ALSO UPDATED Christian Pitts @ [facility] SPOKE TO Christian Pitts.UPDATED Christian Pitts.  12:15P-RECEIVED CALL FOR MED ASST.PATIENT DECLINED MATCH PROGRAM,STATES SINCE MED COST $14 HE CAN AFFORD.PATIENT WILL F/U @ [facility], WILL GO THERE @ [place] TODAY FOR MED ASST SPOKE TO Christian Pitts(Christian Pitts) COST TO PATIENT $14,PATIENT STATES HE  CAN AFFORD & WILL GO [facility] @ [facility].NO MATCH PROGRAM USED.PATIENT WILL ALSO F/U FOR PCP THERE, INFORMED TO WALK IN M-F 10A-6P.PATIENT VOICED  UNDERSTANDING.PATIENT CONTACT INFO Christian Pitts#[phone].

## 2014-02-11 NOTE — Progress Notes (Signed)
Nutrition Brief Note  Patient identified on the Malnutrition Screening Tool (MST) Report  Wt Readings from Last 15 Encounters:  02/11/14 213 lb 13.5 oz (97 kg)  11/10/13 207 lb (93.895 kg)  09/06/13 220 lb (99.791 kg)  08/01/13 230 lb (104.327 kg)    Body mass index is 28.22 kg/(m^2). Patient meets criteria for Overweight based on current BMI.   Current diet order is Heart Healthy, patient is consuming approximately 100% of meals at this time. Labs and medications reviewed.   Pt reported an unintentional wt loss of 120 lbs in past year d/t hyperthyroidism; however it is unclear to how reliable information is as pt has lost 17 lbs in past 6-7 months (7.3% body weight loss, non-severe for time frame) per previous medical records. Appetite has been excellent, consumes three meals/day and snacks frequent. No signs of muscle wasting or subcutaneous fat loss on nutrition focused exam. Encouraged heart healthy proteins with balanced meals and snack for weight maintenance.   No nutrition interventions warranted at this time. If nutrition issues arise, please consult RD.   Lloyd HugerSarah F Denya Buckingham MS RD LDN Clinical Dietitian [phone]

## 2014-02-11 NOTE — Discharge Instructions (Signed)
Please follow up with endocrinology in 1 week

## 2014-02-11 NOTE — Progress Notes (Signed)
Patient had PIV removed as per patient's request.  RN x 2 tried to restart PIV. Unsuccessful.  RN to page IV team for restart.

## 2014-02-11 NOTE — Progress Notes (Signed)
Discharge instructions explained using teach back. Prescriptions given. [facility] notified of patient request for meds assist. Stable for discharge.

## 2014-02-11 NOTE — Discharge Summary (Signed)
Physician Discharge Summary  Christian MourningChristopher Pitts [id number]:[id number] DOB: 03/07/1991 DOA: 02/10/2014  PCP: Doris CheadleADVANI, DEEPAK, MD  Admit date: 02/10/2014 Discharge date: 02/11/2014  Time spent: >35 minutes  Recommendations for Outpatient Follow-up:  F/u with endocrinologist  doctor in 1 week  F/u with PCP in 1 week  Discharge Diagnoses:  Active Problems:   Hyperthyroidism   Tachycardia   Discharge Condition: stable   Diet recommendation: regular   Filed Weights   02/10/14 1612 02/11/14 0426  Weight: 98.431 kg (217 lb) 97 kg (213 lb 13.5 oz)    History of present illness:  23 year old male with past medical history of hyperthyroidism, was [facility] but was lost to follow up who [facility] with worsening fatigue, weakness, palpitations ongoing for some time now. No chest pain, no shortness of breath. No abdominal pain, no nausea or vomiting. No fevers or chills. No reports of blood in stool or urine.    Hospital Course:  Hyperthyroidism, TSH -0.009; Free T4-6.57 -tachycardia improved on BB; started methimazole, cont BB; d/w patient, his wife recommended to f/u with endocrinologist in 1 week; likely needs rad iodine ablation   Procedures:  none (i.e. Studies not automatically included, echos, thoracentesis, etc; not x-rays)  Consultations:  none  Discharge Exam: Filed Vitals:   02/11/14 0426  BP: 127/55  Pulse: 96  Temp: 98 F (36.7 C)  Resp: 20    General: alert Cardiovascular: s1,s2 rrr Respiratory: CTA BL  Discharge Instructions  Discharge Instructions   Diet - low sodium heart healthy    Complete by:  As directed      Discharge instructions    Complete by:  As directed   Please follow up with endocrinologist in 1 week     Increase activity slowly    Complete by:  As directed             Medication List         atenolol 50 MG tablet  Commonly known as:  TENORMIN  Take 1 tablet (50 mg total) by mouth  daily.     methimazole 10 MG tablet  Commonly known as:  TAPAZOLE  Take 1 tablet (10 mg total) by mouth 3 (three) times daily.       No Known Allergies     Follow-up Information   Follow up with [facility]. Schedule an appointment as soon as possible for a visit in 1 week.   Contact information:   [address] [place] [id number]        The results of significant diagnostics from this hospitalization (including imaging, microbiology, ancillary and laboratory) are listed below for reference.    Significant Diagnostic Studies: No results found.  Microbiology: No results found for this or any previous visit (from the past 240 hour(s)).   Labs: Basic Metabolic Panel:  Recent Labs Lab 02/10/14 1341 02/11/14 0435  NA 138 140  K 4.4 4.3  CL 102 103  CO2 24 27  GLUCOSE 107* 87  BUN 11 8  CREATININE 0.50 0.55  CALCIUM 10.1 10.0   Liver Function Tests:  Recent Labs Lab 02/11/14 0435  AST 18  ALT 18  ALKPHOS 148*  BILITOT 0.6  PROT 5.8*  ALBUMIN 3.4*   No results found for this basename: LIPASE, AMYLASE,  in the last 168 hours No results found for this basename: AMMONIA,  in the last 168 hours CBC:  Recent Labs Lab 02/10/14  1341 02/11/14 0435  WBC 4.4 5.2  HGB 12.4* 11.8*  HCT 37.1* 36.4*  MCV 71.9* 73.5*  PLT 196 162   Cardiac Enzymes: No results found for this basename: CKTOTAL, CKMB, CKMBINDEX, TROPONINI,  in the last 168 hours BNP: BNP (last 3 results) No results found for this basename: PROBNP,  in the last 8760 hours CBG:  Recent Labs Lab 02/11/14 0757  GLUCAP 130*       Signed:  Jonette Mate N  [facility] 02/11/2014, 11:45 AM

## 2014-04-19 ENCOUNTER — Encounter (HOSPITAL_COMMUNITY): Payer: Self-pay | Admitting: Emergency Medicine

## 2014-04-19 ENCOUNTER — Inpatient Hospital Stay (HOSPITAL_COMMUNITY)
Admission: EM | Admit: 2014-04-19 | Discharge: 2014-04-21 | DRG: 645 | Disposition: A | Discharge status: Home / Self Care | Attending: Internal Medicine | Admitting: Internal Medicine

## 2014-04-19 ENCOUNTER — Emergency Department (HOSPITAL_COMMUNITY)

## 2014-04-19 DIAGNOSIS — E0590 Thyrotoxicosis, unspecified without thyrotoxic crisis or storm: Secondary | ICD-10-CM | POA: Diagnosis not present

## 2014-04-19 DIAGNOSIS — F17200 Nicotine dependence, unspecified, uncomplicated: Secondary | ICD-10-CM | POA: Diagnosis present

## 2014-04-19 DIAGNOSIS — I498 Other specified cardiac arrhythmias: Secondary | ICD-10-CM | POA: Diagnosis present

## 2014-04-19 DIAGNOSIS — R000 Tachycardia, unspecified: Secondary | ICD-10-CM

## 2014-04-19 DIAGNOSIS — Z91199 Patient's noncompliance with other medical treatment and regimen due to unspecified reason: Secondary | ICD-10-CM

## 2014-04-19 DIAGNOSIS — R079 Chest pain, unspecified: Secondary | ICD-10-CM | POA: Diagnosis present

## 2014-04-19 DIAGNOSIS — Z9119 Patient's noncompliance with other medical treatment and regimen: Secondary | ICD-10-CM

## 2014-04-19 DIAGNOSIS — Z23 Encounter for immunization: Secondary | ICD-10-CM

## 2014-04-19 LAB — I-STAT TROPONIN, ED: TROPONIN I, POC: 0 ng/mL (ref 0.00–0.08)

## 2014-04-19 MED ORDER — NITROGLYCERIN 0.4 MG SL SUBL
0.4000 mg | SUBLINGUAL_TABLET | SUBLINGUAL | Status: DC | PRN
  Administered 2014-04-19 (×3): 0.4 mg via SUBLINGUAL
  Filled 2014-04-19 (×2): qty 1

## 2014-04-19 MED ORDER — METOPROLOL TARTRATE 1 MG/ML IV SOLN
5.0000 mg | Freq: Once | INTRAVENOUS | Status: AC
  Administered 2014-04-19: 5 mg via INTRAVENOUS
  Filled 2014-04-19: qty 5

## 2014-04-19 MED ORDER — LORAZEPAM 2 MG/ML IJ SOLN
1.0000 mg | Freq: Once | INTRAMUSCULAR | Status: AC
  Administered 2014-04-19: 1 mg via INTRAVENOUS
  Filled 2014-04-19: qty 1

## 2014-04-19 NOTE — ED Notes (Signed)
EMS called to [facility].  Found patient with complaints of panic attack. Patient has a history of panic attack.  Patient complains of a 7 of 10 chest pain. Patient received  of ASA.  of zofran.

## 2014-04-19 NOTE — ED Notes (Signed)
Bed: ZO10 Expected date: 04/19/14 Expected time: 10:39 PM Means of arrival: Ambulance Comments: Chest pain from jail

## 2014-04-20 ENCOUNTER — Encounter (HOSPITAL_COMMUNITY): Payer: Self-pay | Admitting: Internal Medicine

## 2014-04-20 DIAGNOSIS — F17200 Nicotine dependence, unspecified, uncomplicated: Secondary | ICD-10-CM | POA: Diagnosis present

## 2014-04-20 DIAGNOSIS — E0590 Thyrotoxicosis, unspecified without thyrotoxic crisis or storm: Secondary | ICD-10-CM | POA: Diagnosis present

## 2014-04-20 DIAGNOSIS — I498 Other specified cardiac arrhythmias: Secondary | ICD-10-CM | POA: Diagnosis present

## 2014-04-20 DIAGNOSIS — R079 Chest pain, unspecified: Secondary | ICD-10-CM | POA: Diagnosis present

## 2014-04-20 DIAGNOSIS — Z91199 Patient's noncompliance with other medical treatment and regimen due to unspecified reason: Secondary | ICD-10-CM | POA: Diagnosis not present

## 2014-04-20 DIAGNOSIS — R072 Precordial pain: Secondary | ICD-10-CM

## 2014-04-20 DIAGNOSIS — Z23 Encounter for immunization: Secondary | ICD-10-CM | POA: Diagnosis not present

## 2014-04-20 LAB — CBC WITH DIFFERENTIAL/PLATELET
BASOS PCT: 0 % (ref 0–1)
Basophils Absolute: 0 10*3/uL (ref 0.0–0.1)
Basophils Absolute: 0 10*3/uL (ref 0.0–0.1)
Basophils Relative: 0 % (ref 0–1)
EOS ABS: 0.1 10*3/uL (ref 0.0–0.7)
EOS PCT: 0 % (ref 0–5)
Eosinophils Absolute: 0 10*3/uL (ref 0.0–0.7)
Eosinophils Relative: 1 % (ref 0–5)
HCT: 40.1 % (ref 39.0–52.0)
HEMATOCRIT: 41.6 % (ref 39.0–52.0)
HEMOGLOBIN: 13.7 g/dL (ref 13.0–17.0)
HEMOGLOBIN: 13.3 g/dL (ref 13.0–17.0)
LYMPHS ABS: 2.8 10*3/uL (ref 0.7–4.0)
LYMPHS PCT: 38 % (ref 12–46)
Lymphocytes Relative: 44 % (ref 12–46)
Lymphs Abs: 3.6 10*3/uL (ref 0.7–4.0)
MCH: 24 pg — ABNORMAL LOW (ref 26.0–34.0)
MCH: 24 pg — AB (ref 26.0–34.0)
MCHC: 33.2 g/dL (ref 30.0–36.0)
MCHC: 32.9 g/dL (ref 30.0–36.0)
MCV: 73 fL — ABNORMAL LOW (ref 78.0–100.0)
MCV: 72.4 fL — AB (ref 78.0–100.0)
MONO ABS: 1.3 10*3/uL — AB (ref 0.1–1.0)
MONOS PCT: 16 % — AB (ref 3–12)
Monocytes Absolute: 1.2 10*3/uL — ABNORMAL HIGH (ref 0.1–1.0)
Monocytes Relative: 15 % — ABNORMAL HIGH (ref 3–12)
NEUTROS ABS: 3.4 10*3/uL (ref 1.7–7.7)
Neutro Abs: 3.3 10*3/uL (ref 1.7–7.7)
Neutrophils Relative %: 46 % (ref 43–77)
Neutrophils Relative %: 40 % — ABNORMAL LOW (ref 43–77)
Platelets: 202 10*3/uL (ref 150–400)
Platelets: 201 10*3/uL (ref 150–400)
RBC: 5.54 MIL/uL (ref 4.22–5.81)
RBC: 5.7 MIL/uL (ref 4.22–5.81)
RDW: 14.6 % (ref 11.5–15.5)
RDW: 14.7 % (ref 11.5–15.5)
WBC: 7.3 10*3/uL (ref 4.0–10.5)
WBC: 8.4 10*3/uL (ref 4.0–10.5)

## 2014-04-20 LAB — BASIC METABOLIC PANEL
Anion gap: 14 (ref 5–15)
BUN: 11 mg/dL (ref 6–23)
CO2: 26 meq/L (ref 19–32)
Calcium: 10 mg/dL (ref 8.4–10.5)
Chloride: 97 mEq/L (ref 96–112)
Creatinine, Ser: 0.59 mg/dL (ref 0.50–1.35)
GFR calc Af Amer: >90 mL/min (ref >90)
GFR calc non Af Amer: >90 mL/min (ref >90)
Glucose, Bld: 99 mg/dL (ref 70–99)
POTASSIUM: 3.7 meq/L (ref 3.7–5.3)
Sodium: 137 mEq/L (ref 137–147)

## 2014-04-20 LAB — URINALYSIS, ROUTINE W REFLEX MICROSCOPIC
BILIRUBIN URINE: NEGATIVE
Glucose, UA: NEGATIVE mg/dL
HGB URINE DIPSTICK: NEGATIVE
Ketones, ur: NEGATIVE mg/dL
Leukocytes, UA: NEGATIVE
NITRITE: NEGATIVE
PH: 7.5 (ref 5.0–8.0)
Protein, ur: NEGATIVE mg/dL
SPECIFIC GRAVITY, URINE: 1.017 (ref 1.005–1.030)
Urobilinogen, UA: 1 mg/dL (ref 0.0–1.0)

## 2014-04-20 LAB — COMPREHENSIVE METABOLIC PANEL
ALBUMIN: 3.6 g/dL (ref 3.5–5.2)
ALT: 17 U/L (ref 0–53)
AST: 19 U/L (ref 0–37)
Alkaline Phosphatase: 147 U/L — ABNORMAL HIGH (ref 39–117)
Anion gap: 11 (ref 5–15)
BILIRUBIN TOTAL: 0.9 mg/dL (ref 0.3–1.2)
BUN: 11 mg/dL (ref 6–23)
CHLORIDE: 101 meq/L (ref 96–112)
CO2: 27 mEq/L (ref 19–32)
Calcium: 10 mg/dL (ref 8.4–10.5)
Creatinine, Ser: 0.6 mg/dL (ref 0.50–1.35)
GFR calc Af Amer: >90 mL/min (ref >90)
GFR calc non Af Amer: >90 mL/min (ref >90)
Glucose, Bld: 116 mg/dL — ABNORMAL HIGH (ref 70–99)
Potassium: 3.5 mEq/L — ABNORMAL LOW (ref 3.7–5.3)
SODIUM: 139 meq/L (ref 137–147)
TOTAL PROTEIN: 6.4 g/dL (ref 6.0–8.3)

## 2014-04-20 LAB — RAPID URINE DRUG SCREEN, HOSP PERFORMED
Amphetamines: NOT DETECTED
BENZODIAZEPINES: NOT DETECTED
Barbiturates: NOT DETECTED
Cocaine: NOT DETECTED
Opiates: NOT DETECTED
TETRAHYDROCANNABINOL: POSITIVE — AB

## 2014-04-20 LAB — TSH: TSH: 0.006 u[IU]/mL — ABNORMAL LOW (ref 0.350–4.500)

## 2014-04-20 LAB — TROPONIN I
Troponin I: <0.3 ng/mL (ref <0.30)
Troponin I: <0.3 ng/mL (ref <0.30)

## 2014-04-20 LAB — T4, FREE: Free T4: 8.96 ng/dL — ABNORMAL HIGH (ref 0.80–1.80)

## 2014-04-20 LAB — T3, FREE

## 2014-04-20 MED ORDER — LABETALOL HCL 5 MG/ML IV SOLN
10.0000 mg | Freq: Once | INTRAVENOUS | Status: AC
  Administered 2014-04-20: 10 mg via INTRAVENOUS
  Filled 2014-04-20: qty 4

## 2014-04-20 MED ORDER — MORPHINE SULFATE 2 MG/ML IJ SOLN
1.0000 mg | INTRAMUSCULAR | Status: DC | PRN
  Administered 2014-04-20 – 2014-04-21 (×3): 1 mg via INTRAVENOUS
  Filled 2014-04-20 (×4): qty 1

## 2014-04-20 MED ORDER — ONDANSETRON HCL 4 MG PO TABS: 4.0000 mg | ORAL_TABLET | Freq: Four times a day (QID) | ORAL | Status: DC | PRN

## 2014-04-20 MED ORDER — METHIMAZOLE 10 MG PO TABS
10.0000 mg | ORAL_TABLET | Freq: Every day | ORAL | Status: DC
  Administered 2014-04-20: 10 mg via ORAL
  Filled 2014-04-20: qty 1

## 2014-04-20 MED ORDER — ENOXAPARIN SODIUM 40 MG/0.4ML ~~LOC~~ SOLN
40.0000 mg | SUBCUTANEOUS | Status: DC
  Administered 2014-04-20 – 2014-04-21 (×2): 40 mg via SUBCUTANEOUS
  Filled 2014-04-20 (×2): qty 0.4

## 2014-04-20 MED ORDER — METHIMAZOLE 10 MG PO TABS
10.0000 mg | ORAL_TABLET | Freq: Three times a day (TID) | ORAL | Status: DC
  Administered 2014-04-20 – 2014-04-21 (×3): 10 mg via ORAL
  Filled 2014-04-20 (×5): qty 1

## 2014-04-20 MED ORDER — ACETAMINOPHEN 325 MG PO TABS
650.0000 mg | ORAL_TABLET | Freq: Four times a day (QID) | ORAL | Status: DC | PRN
Start: 2014-04-20 — End: 2014-04-21
  Administered 2014-04-20: 650 mg via ORAL
  Filled 2014-04-20: qty 2

## 2014-04-20 MED ORDER — ATENOLOL 50 MG PO TABS
50.0000 mg | ORAL_TABLET | Freq: Every day | ORAL | Status: DC
  Administered 2014-04-20 – 2014-04-21 (×2): 50 mg via ORAL
  Filled 2014-04-20 (×2): qty 1

## 2014-04-20 MED ORDER — ACETAMINOPHEN 650 MG RE SUPP: 650.0000 mg | Freq: Four times a day (QID) | RECTAL | Status: DC | PRN

## 2014-04-20 MED ORDER — SODIUM CHLORIDE 0.9 % IV SOLN
INTRAVENOUS | Status: AC
  Administered 2014-04-20: 03:00:00 via INTRAVENOUS

## 2014-04-20 MED ORDER — SODIUM CHLORIDE 0.9 % IJ SOLN
3.0000 mL | Freq: Two times a day (BID) | INTRAMUSCULAR | Status: DC
  Administered 2014-04-20 – 2014-04-21 (×3): 3 mL via INTRAVENOUS

## 2014-04-20 MED ORDER — PNEUMOCOCCAL VAC POLYVALENT 25 MCG/0.5ML IJ INJ
0.5000 mL | INJECTION | INTRAMUSCULAR | Status: AC
  Administered 2014-04-21: 0.5 mL via INTRAMUSCULAR
  Filled 2014-04-20 (×2): qty 0.5

## 2014-04-20 MED ORDER — ONDANSETRON HCL 4 MG/2ML IJ SOLN: 4.0000 mg | Freq: Four times a day (QID) | INTRAMUSCULAR | Status: DC | PRN

## 2014-04-20 MED ORDER — BOOST PLUS PO LIQD
237.0000 mL | ORAL | Status: DC
  Administered 2014-04-20: 237 mL via ORAL
  Filled 2014-04-20 (×2): qty 237

## 2014-04-20 MED ORDER — INFLUENZA VAC SPLIT QUAD 0.5 ML IM SUSY
0.5000 mL | PREFILLED_SYRINGE | INTRAMUSCULAR | Status: AC
  Administered 2014-04-21: 0.5 mL via INTRAMUSCULAR
  Filled 2014-04-20 (×2): qty 0.5

## 2014-04-20 MED ORDER — SODIUM CHLORIDE 0.9 % IV SOLN
INTRAVENOUS | Status: DC
Start: 2014-04-20 — End: 2014-04-20

## 2014-04-20 MED ORDER — ASPIRIN EC 325 MG PO TBEC
325.0000 mg | DELAYED_RELEASE_TABLET | Freq: Every day | ORAL | Status: DC
  Administered 2014-04-20 – 2014-04-21 (×2): 325 mg via ORAL
  Filled 2014-04-20 (×2): qty 1

## 2014-04-20 NOTE — Progress Notes (Signed)
INITIAL NUTRITION ASSESSMENT  DOCUMENTATION CODES Per approved criteria  -Not Applicable   INTERVENTION: -Recommend Boost Plus once daily -Encouraged intake of balanced snacks for weight and muscle maintenance -RD to continue to monitor  NUTRITION DIAGNOSIS: Unintentional wt loss related to chronic disease as evidenced by hyperthyroidism, 120 lb weight loss in two years.   Goal: Pt to meet >/= 90% of their estimated nutrition needs    Monitor:  Total protein/energy intake, labs, weight  Reason for Assessment: MST  23 y.o. male  Admitting Dx: Thyrotoxicosis  ASSESSMENT: Christian Pitts is a 23 y.o. male with history of hyperthyroidism was [facility] from the jail after patient was complaining of chest pain  -Pt endorsed an unintentional 120 lb wt loss that has occurred over past 1.5-2 years (36% body weight loss, non-severe for time frame). Usual body weight was 335 lbs before weight loss began. -Previous medical records indicate pt has lost approximately 15 lb in past 6 months (6.8% body weight loss, non-significant for time frame) -Diet recall indicates pt consuming 3-4 meals/day. Is currently incarcerated, therefore three balanced meals of protein/starch/vegetable are provided. Pt also snacks on chips, crackers, etc as available. -Has progressively felt weaker with weight loss, feels as if he had more energy at heavier weight. Encouraged intake of heart healthy protein snacks and offered pt to trial Boost supplement once daily to assist in energy intake -Pt with hx of hyperthyroidism and non-compliance with medications d/t financial reasons, which has likely contributed to weight loss -Currently on Heart healthy diet with 100% PO intake.Pt also snacking on graham crackers and peanut butter during RD assessment.  Height: Ht Readings from Last 1 Encounters:  04/20/14  (1.854 m)    Weight: Wt Readings from Last 1 Encounters:  04/20/14 205 lb 11.2 oz (93.305 kg)     Ideal Body Weight: 184 lbs  % Ideal Body Weight: 111%  Wt Readings from Last 10 Encounters:  04/20/14 205 lb 11.2 oz (93.305 kg)  02/11/14 213 lb 13.5 oz (97 kg)  11/10/13 207 lb (93.895 kg)  09/06/13 220 lb (99.791 kg)  08/01/13 230 lb (104.327 kg)    Usual Body Weight: 335 lb (2 years ago), 220-230 lbs (6-9 months ago)  % Usual Body Weight: 62%, 93%  BMI:  Body mass index is 27.14 kg/(m^2).  Estimated Nutritional Needs: Kcal: 1900-2100 Protein: 90-100 gram Fluid: >/=1900 ml/daily  Skin: WDL  Diet Order: Cardiac  EDUCATION NEEDS: -Education needs addressed   Intake/Output Summary (Last 24 hours) at 04/20/14 1422 Last data filed at 04/20/14 1320  Gross per 24 hour  Intake 936.67 ml  Output    500 ml  Net 436.67 ml    Last BM: 9/15   Labs:   Recent Labs Lab 04/19/14 2333 04/20/14 0426  NA 137 139  K 3.7 3.5*  CL 97 101  CO2 26 27  BUN 11 11  CREATININE 0.59 0.60  CALCIUM 10.0 10.0  GLUCOSE 99 116*    CBG (last 3)  No results found for this basename: GLUCAP,  in the last 72 hours  Scheduled Meds: . aspirin EC  325 mg Oral Daily  . atenolol  50 mg Oral Daily  . enoxaparin (LOVENOX) injection  40 mg Subcutaneous Q24H  . [START ON 04/21/2014] Influenza vac split quadrivalent PF  0.5 mL Intramuscular Tomorrow-1000  . lactose free nutrition  237 mL Oral Q24H  . methimazole  10 mg Oral Daily  . [START ON 04/21/2014] pneumococcal 23 valent vaccine  0.5 mL Intramuscular Tomorrow-1000  . sodium chloride  3 mL Intravenous Q12H    Continuous Infusions: . sodium chloride 75 mL/hr at 04/20/14 0330    Past Medical History  Diagnosis Date  . Anxiety   . Weight decrease   . Thyroid disorder     Past Surgical History  Procedure Laterality Date  . No past surgeries      Lloyd Huger MS RD LDN Clinical Dietitian Pager:[phone]

## 2014-04-20 NOTE — ED Provider Notes (Signed)
CSN: [id number]     Arrival date & time 04/19/14  2248 History   First MD Initiated Contact with Patient 04/19/14 2302     Chief Complaint  Patient presents with  . Panic Attack   HPI  Patient is a 23 y.o male who presents to the ED with chest pain, shortness of breath, and anxiety for the past 3 hours.  Patient states that he is having left sided and substernal chest pain which started this evening when he was laying down.  Patient states the pain is sharp and is radiating to his back and his neck. Patient complains of accompanying shortness of breath, sweating, abdominal pain, diarrhea x 7 episodes per day, and some tingling and numbness of the bilateral legs.  Patient states that he has had similar symptoms in the past and relates them to both anxiety and to his untreated hyperthyroidism.  Patient was seen and admitted on 02/10/14 for tachycardia and uncontrolled hyperthyroidism.  He was started on inderal 60 mg and methimazole.  Patient has not been taking these medications as he is unable to afford them.   Past Medical History  Diagnosis Date  . Anxiety   . Weight decrease   . Thyroid disorder    History reviewed. No pertinent past surgical history. Family History  Problem Relation Age of Onset  . Thyroid disease Father   . Hypertension Father    History  Substance Use Topics  . Smoking status: Current Every Day Smoker  . Smokeless tobacco: Not on file  . Alcohol Use: Yes     Comment: occasionallty    Review of Systems  See HPI, all other ROS are negative  Allergies  Review of patient's allergies indicates no known allergies.  Home Medications   Prior to Admission medications   Medication Sig Start Date End Date Taking? Authorizing Provider  aspirin 325 MG tablet Take 325 mg by mouth once.   Yes Historical Provider, MD  ondansetron (ZOFRAN) 4 MG tablet Take 4 mg by mouth once.   Yes Historical Provider, MD   BP 130/47  Pulse 115  Temp(Src) 98.3 F (36.8 C) (Oral)   Resp 21  SpO2 100% Physical Exam  Nursing note and vitals reviewed. Constitutional: He is oriented to person, place, and time. He appears well-developed and well-nourished. No distress.  HENT:  Head: Normocephalic and atraumatic.  Mouth/Throat: Oropharynx is clear and moist. No oropharyngeal exudate.  Eyes: Conjunctivae and EOM are normal. Pupils are equal, round, and reactive to light. No scleral icterus.  Neck: Normal range of motion. Neck supple. No JVD present. Thyromegaly present.  Cardiovascular: Regular rhythm and intact distal pulses.   No extrasystoles are present. Tachycardia present.  Exam reveals no gallop and no friction rub.   No murmur heard. Pulmonary/Chest: Effort normal and breath sounds normal. No respiratory distress. He has no wheezes. He has no rales. He exhibits no tenderness.  Abdominal: Soft. Bowel sounds are normal. He exhibits no distension and no mass. There is no tenderness. There is no rebound and no guarding.  Musculoskeletal: Normal range of motion.  Lymphadenopathy:    He has no cervical adenopathy.  Neurological: He is alert and oriented to person, place, and time. No cranial nerve deficit. Coordination normal.  Skin: Skin is warm and dry. He is not diaphoretic.  Psychiatric: He has a normal mood and affect. His behavior is normal. Judgment and thought content normal.    ED Course  Procedures (including critical care time) Labs Review Labs  Reviewed  CBC WITH DIFFERENTIAL - Abnormal; Notable for the following:    MCV 72.4 (*)    MCH 24.0 (*)    All other components within normal limits  URINALYSIS, ROUTINE W REFLEX MICROSCOPIC - Abnormal; Notable for the following:    APPearance CLOUDY (*)    All other components within normal limits  BASIC METABOLIC PANEL  TSH  T3, FREE  T4, FREE  I-STAT TROPOININ, ED    Imaging Review Dg Chest 2 View  04/20/2014   CLINICAL DATA:  Panic attack.  Shortness of breath.  Dizziness.  EXAM: CHEST  2 VIEW   COMPARISON:  03/03/2013  FINDINGS: The heart size and mediastinal contours are within normal limits. Both lungs are clear. The visualized skeletal structures are unremarkable.  IMPRESSION: No active cardiopulmonary disease.   Electronically Signed   By: Rosalie Gums M.D.   On: 04/20/2014 01:02     EKG Interpretation   Date/Time:  Tuesday April 19 2014 22:51:54 EDT Ventricular Rate:  139 PR Interval:  127 QRS Duration: 98 QT Interval:  402 QTC Calculation: 611 R Axis:   97 Text Interpretation:  Sinus or ectopic atrial tachycardia Borderline right  axis deviation ST elev, probable normal early repol pattern Prolonged QT  interval AGREE. CONSISTENT WITH OLD PATTERN. Confirmed by Donnald Garre, MD,  Lebron Conners [phone]) on 04/19/2014 11:01:30 PM      MDM   Final diagnoses:  Chest pain, unspecified  Hyperthyroidism  Tachycardia   Patient is a 23 y.o. Male who presents to the ED with chest pain and shortness of breath.  Patient is non-compliant with his hyperthyroidism treatment.  Patient is tachycardic and hypertensive on initial examination.  Patient does have thyromegaly but has an otherwise normal physical examination.  Patient was treated with nitroglycerin, ativan, and lopressor 5 mg.  CBC, BMP, istat troponin, CXR, TSH, T3, T4 were ordered here at this time.    12:15 pm Patient reexamined.  BP normalized.  Patient remains tachycardic with HR 122 when I was in the room.  Patient states that chest pain is improved, but is still 6/10 pain.  1:00 am Patient continues to have chest pain despite multiple sublingual nitroglycerine.  Patient rates his pain at this time to be a 5/10.  CBC reveals no leukocytosis.  CMP reveals normal SCr with no acute abnormalities.  TSH is pending.  UA is pending.  Patient has very fickle blood pressure.  Given continued chest pain, tachycardia, and difficult to control blood pressure will attempt to admit to the hospitalist.    I have spoken with Dr. Toniann Fail who  will admit the patient to Team 8 tele.   The above patient was discussed with Dr. Silverio Lay who agrees with the above plan and workup.      Lily Peer Forcucci, PA-C 04/20/14 0130

## 2014-04-20 NOTE — Care Management Note (Addendum)
    Page 1 of 1   04/21/2014     2:53:00 PM CARE MANAGEMENT NOTE 04/21/2014  Patient:  Christian Pitts, Christian Pitts   Account Number:  [id number]  Date Initiated:  04/20/2014  Documentation initiated by:  Lanier Clam  Subjective/Objective Assessment:   23 Y/O M ADMITTED W/THYROTOXICOSIS.     Action/Plan:   FROM JAIL.   Anticipated DC Date:  04/21/2014   Anticipated DC Plan[facility]  [facility]      DC Planning Services  CM consult      Choice offered to / List presented to:             Status of service:  Completed, signed off Medicare Important Message given?   (If response is "NO", the following Medicare IM given date fields will be blank) Date Medicare IM given:   Medicare IM given by:   Date Additional Medicare IM given:   Additional Medicare IM given by:    Discharge Disposition:  [facility]  Per UR Regulation:  Reviewed for med. necessity/level of care/duration of stay  If discussed at Long Length of Stay Meetings, dates discussed:    Comments:  04/20/14 Tatum Corl RN,BSN NCM [phone] NO ANTICIPATED D/C NEEDS.

## 2014-04-20 NOTE — ED Provider Notes (Signed)
Medical screening examination/treatment/procedure(s) were performed by non-physician practitioner and as supervising physician I was immediately available for consultation/collaboration.   EKG Interpretation   Date/Time:  Tuesday April 19 2014 22:51:54 EDT Ventricular Rate:  139 PR Interval:  127 QRS Duration: 98 QT Interval:  402 QTC Calculation: 611 R Axis:   97 Text Interpretation:  Sinus or ectopic atrial tachycardia Borderline right  axis deviation ST elev, probable normal early repol pattern Prolonged QT  interval AGREE. CONSISTENT WITH OLD PATTERN. Confirmed by Donnald Garre, MD,  Lebron Conners [phone]) on 04/19/2014 11:01:30 PM        Richardean Canal, MD 04/20/14 [phone]

## 2014-04-20 NOTE — Progress Notes (Signed)
Echo Lab  2D Echocardiogram completed.  Letcher Schweikert L Johanne Mcglade, RDCS 04/20/2014 2:23 PM

## 2014-04-20 NOTE — H&P (Addendum)
[facility] History and Physical  Christian Pitts ZOX:[id number] DOB: 11/10/1990 DOA: 04/19/2014  Referring physician: ER physician. PCP: Jeanann Lewandowsky, MD   Chief Complaint: Chest pain.  HPI: Christian Pitts is a 23 y.o. male with history of hyperthyroidism [facility] after patient was complaining of chest pain. Patient states he has been having chest pain off and on but last night it increased. Pain is mostly retrosternal radiating to the back and neck. Patient was found to be in sinus tachycardia in the ER. Patient has history of hyperthyroidism and was admitted to the hospital last July 2 months ago. Patient has not been taking his medications since discharge as patient states he has not been able to afford the medication. In the ER patient's thyroid function tests has been sent and chest x-ray does not show anything acute and cardiac markers has been negative. Patient will be admitted for thyrotoxicosis. Patient otherwise denies any nausea vomiting shortness of breath. He has been having some tingling and numbness of the extremities. Patient is alert awake and oriented and follows commands and moves all extremities.   Review of Systems: As presented in the history of presenting illness, rest negative.  Past Medical History  Diagnosis Date  . Anxiety   . Weight decrease   . Thyroid disorder    Past Surgical History  Procedure Laterality Date  . No past surgeries     Social History:  reports that he has been smoking.  He does not have any smokeless tobacco history on file. He reports that he drinks alcohol. He reports that he does not use illicit drugs. Where does patient live home. Can patient participate in ADLs? Yes.  No Known Allergies  Family History:  Family History  Problem Relation Age of Onset  . Thyroid disease Father   . Hypertension Father       Prior to Admission medications   Medication Sig Start Date End Date Taking?  Authorizing Provider  aspirin 325 MG tablet Take 325 mg by mouth once.   Yes Historical Provider, MD  ondansetron (ZOFRAN) 4 MG tablet Take 4 mg by mouth once.   Yes Historical Provider, MD    Physical Exam: Filed Vitals:   04/20/14 0130 04/20/14 0143 04/20/14 0200 04/20/14 0245  BP: 138/61 138/61 103/79 152/82  Pulse: 113  114 116  Temp:    98.2 F (36.8 C)  TempSrc:    Oral  Resp: Height:     (1.854 m)  Weight:    48.626 kg (107 lb 3.2 oz)  SpO2: 100% 100% 100% 100%     General:  Well-developed and nourished.  Eyes: Anicteric no pallor.  ENT: No discharge from the ears eyes nose or mouth.  Neck: No mass felt.  Cardiovascular: S1-S2 heard.  Respiratory: No rhonchi or crepitations.  Abdomen: Soft nontender bowel sounds present. No guarding or rigidity.  Skin: No rash.  Musculoskeletal: No edema.  Psychiatric: Appears normal.  Neurologic: Alert awake oriented to time place and person. Moves all extremities 5 x 5.  Labs on Admission:  Basic Metabolic Panel:  Recent Labs Lab 04/19/14 2333  NA 137  K 3.7  CL 97  CO2 26  GLUCOSE 99  BUN 11  CREATININE 0.59  CALCIUM 10.0   Liver Function Tests: No results found for this basename: AST, ALT, ALKPHOS, BILITOT, PROT, ALBUMIN,  in the last 168 hours No results found for this basename: LIPASE, AMYLASE,  in the  last 168 hours No results found for this basename: AMMONIA,  in the last 168 hours CBC:  Recent Labs Lab 04/19/14 2333  WBC 7.3  NEUTROABS 3.3  HGB 13.3  HCT 40.1  MCV 72.4*  PLT 202   Cardiac Enzymes: No results found for this basename: CKTOTAL, CKMB, CKMBINDEX, TROPONINI,  in the last 168 hours  BNP (last 3 results) No results found for this basename: PROBNP,  in the last 8760 hours CBG: No results found for this basename: GLUCAP,  in the last 168 hours  Radiological Exams on Admission: Dg Chest 2 View  04/20/2014   CLINICAL DATA:  Panic attack.  Shortness of breath.   Dizziness.  EXAM: CHEST  2 VIEW  COMPARISON:  03/03/2013  FINDINGS: The heart size and mediastinal contours are within normal limits. Both lungs are clear. The visualized skeletal structures are unremarkable.  IMPRESSION: No active cardiopulmonary disease.   Electronically Signed   By: Rosalie Gums M.D.   On: 04/20/2014 01:02    EKG: Independently reviewed. Sinus tachycardia.  Assessment/Plan Principal Problem:   Thyrotoxicosis Active Problems:   Chest pain   1. Thyrotoxicosis - patient's symptoms are consistent with thyrotoxicosis. Thyroid function tests are pending but given that patient has history of hyperthyroidism and was recently admitted for hyperthyroidism and labs were consistent at that time and I have placed patient on methimazole 10 mg along with atenolol 50 mg. Patient may eventually need followup with endocrinologist. Since patient is on methimazole patient's LFTs and CBC has to be closely followed. 2. Chest pain - probably from #1. At this time we will cycle cardiac markers. Check 2D Echo. NTG and Morphine PRN. If patient's chest pain does not improve with control of heart rate then may need further evaluation. Check drug screen. 3. Elevated blood pressure - closely follow blood pressure trends. At this time I have ordered atenolol.    Code Status: Full code.  Family Communication: None.  Disposition Plan: Admit to inpatient.    KAKRAKANDY,ARSHAD N. [facility] Pager [phone].  If 7PM-7AM, please contact night-coverage [email or web address] Password [email or web address] 04/20/2014, 4:10 AM

## 2014-04-20 NOTE — Progress Notes (Signed)
Patient seen and examined, and data base reviewed. Patient seen earlier today by my colleague Dr. Toniann Fail. Has history of hyperthyroidism, came into the hospital complaining about chest pain. Rule out acute coronary syndrome in the setting of hyperthyroidism and tachycardia.  Restarted on methimazole and atenolol.    Clint Lipps Pager: [phone] 04/20/2014, 2:06 PM

## 2014-04-21 DIAGNOSIS — R000 Tachycardia, unspecified: Secondary | ICD-10-CM

## 2014-04-21 MED ORDER — ATENOLOL 50 MG PO TABS
50.0000 mg | ORAL_TABLET | Freq: Every day | ORAL | Status: AC
Start: 2014-04-21 — End: ?

## 2014-04-21 MED ORDER — METHIMAZOLE 10 MG PO TABS: 30.0000 mg | ORAL_TABLET | Freq: Every day | ORAL | Status: AC

## 2014-04-21 NOTE — Discharge Summary (Signed)
Physician Discharge Summary  Christian Pitts WUX:[id number] DOB: Oct 24, 1990 DOA: 04/19/2014  PCP: Jeanann Lewandowsky, MD  Admit date: 04/19/2014 Discharge date: 04/21/2014  Time spent: 40 minutes  Recommendations for Outpatient Follow-up:  1. Follow up with PCP in 1 week  Discharge Diagnoses:  Principal Problem:   Thyrotoxicosis Active Problems:   Chest pain   Discharge Condition: Stable  Diet recommendation: Regular  Filed Weights   04/20/14 0245 04/20/14 1405  Weight: 48.626 kg (107 lb 3.2 oz) 93.305 kg (205 lb 11.2 oz)    History of present illness:  Christian Pitts is a 23 y.o. male with history of hyperthyroidism [facility] after patient was complaining of chest pain. Patient states he has been having chest pain off and on but last night it increased. Pain is mostly retrosternal radiating to the back and neck. Patient was found to be in sinus tachycardia in the ER. Patient has history of hyperthyroidism and was admitted to the hospital last July 2 months ago. Patient has not been taking his medications since discharge as patient states he has not been able to afford the medication. In the ER patient's thyroid function tests has been sent and chest x-ray does not show anything acute and cardiac markers has been negative. Patient will be admitted for thyrotoxicosis. Patient otherwise denies any nausea vomiting shortness of breath. He has been having some tingling and numbness of the extremities. Patient is alert awake and oriented and follows commands and moves all extremities.   Hospital Course:   Hyperthyroidism -Has history of hyperthyroidism, symptoms consistent with mild thyrotoxicosis. -His TSH and 0.006, free T4 is 8.96 and a free T3 is more than 20. -Patient started on atenolol 50 mg daily. -Started on methimazole 10 mg every 8 hours, his symptoms controlled. -Discharge on atenolol 50 mg and methimazole 30 mg daily (can be adjusted up to 60 mg  daily). -Based on previous recommendation from endocrinology patient should have a thyroid uptake scan. -That was not done in the hospital because patient was started on methimazole already. -Patient needs to be off of methimazole for at least one week.  Chest pain -Noncardiac chest pain 3 sets of cardiac enzymes are negative. -Unclear if this is related to his tachycardia but chest pain is resolved.  Tachycardia -Sinus tachycardia likely secondary to hyperthyroidism, improved after starting atenolol.  Procedures:  None  Consultations:  none  Discharge Exam: Filed Vitals:   04/21/14 1043  BP: 146/56  Pulse: 100  Temp:   Resp:    General: Alert and awake, oriented x3, not in any acute distress. HEENT: anicteric sclera, pupils reactive to light and accommodation, EOMI CVS: S1-S2 clear, no murmur rubs or gallops Chest: clear to auscultation bilaterally, no wheezing, rales or rhonchi Abdomen: soft nontender, nondistended, normal bowel sounds, no organomegaly Extremities: no cyanosis, clubbing or edema noted bilaterally Neuro: Cranial nerves II-XII intact, no focal neurological deficits   Discharge Instructions You were cared for by a hospitalist during your hospital stay. If you have any questions about your discharge medications or the care you received while you were in the hospital after you are discharged, you can call the unit and asked to speak with the hospitalist on call if the hospitalist that took care of you is not available. Once you are discharged, your primary care physician will handle any further medical issues. Please note that NO REFILLS for any discharge medications will be authorized once you are discharged, as it is imperative that you return  to your primary care physician (or establish a relationship with a primary care physician if you do not have one) for your aftercare needs so that they can reassess your need for medications and monitor your lab  values.   Current Discharge Medication List    START taking these medications   Details  atenolol (TENORMIN) 50 MG tablet Take 1 tablet (50 mg total) by mouth daily. Qty: 30 tablet, Refills: 0    methimazole (TAPAZOLE) 10 MG tablet Take 3 tablets (30 mg total) by mouth daily. Qty: 90 tablet, Refills: 0      STOP taking these medications     aspirin 325 MG tablet      ondansetron (ZOFRAN) 4 MG tablet        No Known Allergies Follow-up Information   Follow up with JEGEDE, OLUGBEMIGA, MD In 1 week.   Specialty:  Internal Medicine   Contact information:   [address] [place] [address] [phone]        The results of significant diagnostics from this hospitalization (including imaging, microbiology, ancillary and laboratory) are listed below for reference.    Significant Diagnostic Studies: Dg Chest 2 View  04/20/2014   CLINICAL DATA:  Panic attack.  Shortness of breath.  Dizziness.  EXAM: CHEST  2 VIEW  COMPARISON:  03/03/2013  FINDINGS: The heart size and mediastinal contours are within normal limits. Both lungs are clear. The visualized skeletal structures are unremarkable.  IMPRESSION: No active cardiopulmonary disease.   Electronically Signed   By: Rosalie Gums M.D.   On: 04/20/2014 01:02    Microbiology: No results found for this or any previous visit (from the past 240 hour(s)).   Labs: Basic Metabolic Panel:  Recent Labs Lab 04/19/14 2333 04/20/14 0426  NA 137 139  K 3.7 3.5*  CL 97 101  CO2 26 27  GLUCOSE 99 116*  BUN 11 11  CREATININE 0.59 0.60  CALCIUM 10.0 10.0   Liver Function Tests:  Recent Labs Lab 04/20/14 0426  AST 19  ALT 17  ALKPHOS 147*  BILITOT 0.9  PROT 6.4  ALBUMIN 3.6   No results found for this basename: LIPASE, AMYLASE,  in the last 168 hours No results found for this basename: AMMONIA,  in the last 168 hours CBC:  Recent Labs Lab 04/19/14 2333 04/20/14 0426  WBC 7.3 8.4  NEUTROABS 3.3 3.4  HGB 13.3  13.7  HCT 40.1 41.6  MCV 72.4* 73.0*  PLT 202 201   Cardiac Enzymes:  Recent Labs Lab 04/20/14 0426 04/20/14 1049 04/20/14 1614  TROPONINI <0.30 <0.30 <0.30   BNP: BNP (last 3 results) No results found for this basename: PROBNP,  in the last 8760 hours CBG: No results found for this basename: GLUCAP,  in the last 168 hours     Signed:  Shonta Bourque A  [facility] 04/21/2014, 11:25 AM

## 2014-08-15 ENCOUNTER — Emergency Department (HOSPITAL_COMMUNITY)
Admission: EM | Admit: 2014-08-15 | Discharge: 2014-08-15 | Disposition: A | Discharge status: Home / Self Care | Payer: Self-pay | Attending: Emergency Medicine | Admitting: Emergency Medicine

## 2014-08-15 ENCOUNTER — Encounter (HOSPITAL_COMMUNITY): Payer: Self-pay

## 2014-08-15 DIAGNOSIS — J020 Streptococcal pharyngitis: Secondary | ICD-10-CM | POA: Insufficient documentation

## 2014-08-15 DIAGNOSIS — Z720 Tobacco use: Secondary | ICD-10-CM | POA: Insufficient documentation

## 2014-08-15 DIAGNOSIS — E079 Disorder of thyroid, unspecified: Secondary | ICD-10-CM | POA: Insufficient documentation

## 2014-08-15 DIAGNOSIS — Z79899 Other long term (current) drug therapy: Secondary | ICD-10-CM | POA: Insufficient documentation

## 2014-08-15 DIAGNOSIS — Z8659 Personal history of other mental and behavioral disorders: Secondary | ICD-10-CM | POA: Insufficient documentation

## 2014-08-15 MED ORDER — HYDROCODONE-ACETAMINOPHEN 7.5-325 MG/15ML PO SOLN
10.0000 mL | Freq: Once | ORAL | Status: AC
  Administered 2014-08-15: 10 mL via ORAL
  Filled 2014-08-15: qty 15

## 2014-08-15 MED ORDER — PENICILLIN G BENZATHINE 1200000 UNIT/2ML IM SUSP
1.2000 10*6.[IU] | Freq: Once | INTRAMUSCULAR | Status: AC
  Administered 2014-08-15: 1.2 10*6.[IU] via INTRAMUSCULAR
  Filled 2014-08-15: qty 2

## 2014-08-15 MED ORDER — DEXAMETHASONE SODIUM PHOSPHATE 10 MG/ML IJ SOLN
10.0000 mg | Freq: Once | INTRAMUSCULAR | Status: AC
  Administered 2014-08-15: 10 mg via INTRAMUSCULAR
  Filled 2014-08-15: qty 1

## 2014-08-15 MED ORDER — HYDROCODONE-ACETAMINOPHEN 7.5-325 MG/15ML PO SOLN: 15.0000 mL | Freq: Four times a day (QID) | ORAL | Status: DC | PRN

## 2014-08-15 NOTE — Discharge Instructions (Signed)
Call for a follow up appointment with a Family or Primary Care Provider.  Return if Symptoms worsen.   Take medication as prescribed.  Do not operate heavy machinery or drink alcohol while taking narcotic medication. Drink plenty of fluids.

## 2014-08-15 NOTE — ED Notes (Signed)
Patient reports being seen by a physician today and diagnosed with strep throat.  He reports that he has not been able to get his medication filled.

## 2014-08-15 NOTE — ED Notes (Signed)
Bed: WA09 Expected date:  Expected time:  Means of arrival:  Comments: EMS  Strep throat

## 2014-08-15 NOTE — ED Notes (Signed)
Patient's mother reports that patient was [place], where he received  7 prescriptions after visiting Friday am.    She only filled two due to financial hardship.  She states they got to  around 1500 and received a call saying that strep test was positive, but they were several hours away from their pharmacy.  Patient declines using Tylenol and Ibuprofen today.

## 2014-08-15 NOTE — ED Provider Notes (Signed)
CSN: [id number]     Arrival date & time 08/15/14  0535 History   First MD Initiated Contact with Patient 08/15/14 [phone]     Chief Complaint  Patient presents with  . Sore Throat     (Consider location/radiation/quality/duration/timing/severity/associated sxs/prior Treatment) HPI Comments: The patient is a 24 year old male past history of thyroid disorder presenting emergency room chief complaint of sore throat for 5 days. Patient reports recently evaluated in [place] for similar symptoms 3 days ago, with a negative rapid strep. Patient was contacted for positive strep culture, unable to fill medications due to financial situation. Patient reports persistent sore throat, unchanged in severity over the past 5 days, worse with swallowing. Patient reports T-Max 103. No treatment prior to arrival. Pt unsure what medication he was prescribed and which 2 were filled. PCP: Marcelino [place], [place]  Patient is a 24 y.o. male presenting with pharyngitis. The history is provided by the patient. No language interpreter was used.  Sore Throat Associated symptoms include a fever and a sore throat.    Past Medical History  Diagnosis Date  . Anxiety   . Weight decrease   . Thyroid disorder    Past Surgical History  Procedure Laterality Date  . No past surgeries     Family History  Problem Relation Age of Onset  . Thyroid disease Father   . Hypertension Father    History  Substance Use Topics  . Smoking status: Current Every Day Smoker  . Smokeless tobacco: Not on file  . Alcohol Use: Yes     Comment: occasionallty    Review of Systems  Constitutional: Positive for fever.  HENT: Positive for sore throat. Negative for drooling.       Allergies  Review of patient's allergies indicates no known allergies.  Home Medications   Prior to Admission medications   Medication Sig Start Date End Date Taking? Authorizing Provider  methimazole (TAPAZOLE) 10 MG tablet Take 3 tablets (30 mg total) by  mouth daily. 04/21/14  Yes Clydia Llano, MD  atenolol (TENORMIN) 50 MG tablet Take 1 tablet (50 mg total) by mouth daily. 04/21/14   Mutaz Elmahi, MD   BP 150/62 mmHg  Pulse 101  Temp(Src) 98.1 F (36.7 C) (Oral)  Resp 16  Ht  (1.854 m)  Wt 180 lb (81.647 kg)  BMI 23.75 kg/m2  SpO2 97% Physical Exam  Constitutional: He appears well-developed and well-nourished.  Non-toxic appearance. He does not have a sickly appearance. He does not appear ill. No distress.  HENT:  Head: Normocephalic and atraumatic.  Mouth/Throat: Uvula is midline and mucous membranes are normal. No trismus in the jaw. Oropharyngeal exudate present. No tonsillar abscesses.  Uvula midline. Tonsils 3+ bilaterally. Handling secretions.  Eyes: EOM are normal. Pupils are equal, round, and reactive to light.  Cardiovascular: Normal rate and regular rhythm.   Pulmonary/Chest: Effort normal and breath sounds normal. He has no wheezes. He has no rales.  Skin: He is not diaphoretic.  Nursing note and vitals reviewed.   ED Course  Procedures (including critical care time) Labs Review Labs Reviewed - No data to display  Imaging Review No results found.   EKG Interpretation None      MDM   Final diagnoses:  Strep pharyngitis  Patient with positive strep at another facility. No trismus, afebrile, not signs of PTA, handling secretions.  Will treat with antibiotics, decadron, pain medication.  Meds given in ED:  Medications  penicillin g benzathine (BICILLIN LA) 1200000 UNIT/2ML  injection 1.2 Million Units (1.2 Million Units Intramuscular Given 08/15/14 0734)  dexamethasone (DECADRON) injection 10 mg (10 mg Intramuscular Given 08/15/14 0735)  HYDROcodone-acetaminophen (HYCET) 7.5-325 mg/15 ml solution 10 mL (10 mLs Oral Given 08/15/14 0733)    New Prescriptions   HYDROCODONE-ACETAMINOPHEN (HYCET) 7.5-325 MG/15 ML SOLUTION    Take 15 mLs by mouth every 6 (six) hours as needed for moderate pain or severe pain.     Mellody DrownLauren Kaian Fahs, PA-C 08/15/14 [phone]  Flint MelterElliott L Wentz, MD 08/15/14 [phone]

## 2014-08-15 NOTE — ED Notes (Signed)
Patient was educated not to drive, operate heavy machinery, or drink alcohol while taking narcotic medication.  

## 2014-08-24 ENCOUNTER — Encounter (HOSPITAL_COMMUNITY): Payer: Self-pay | Admitting: Emergency Medicine

## 2014-08-24 ENCOUNTER — Emergency Department (HOSPITAL_COMMUNITY)
Admission: EM | Admit: 2014-08-24 | Discharge: 2014-08-24 | Disposition: A | Discharge status: Home / Self Care | Payer: Self-pay | Attending: Emergency Medicine | Admitting: Emergency Medicine

## 2014-08-24 DIAGNOSIS — H9201 Otalgia, right ear: Secondary | ICD-10-CM | POA: Insufficient documentation

## 2014-08-24 DIAGNOSIS — Z8659 Personal history of other mental and behavioral disorders: Secondary | ICD-10-CM | POA: Insufficient documentation

## 2014-08-24 DIAGNOSIS — Z720 Tobacco use: Secondary | ICD-10-CM | POA: Insufficient documentation

## 2014-08-24 DIAGNOSIS — J029 Acute pharyngitis, unspecified: Secondary | ICD-10-CM

## 2014-08-24 DIAGNOSIS — J209 Acute bronchitis, unspecified: Secondary | ICD-10-CM | POA: Insufficient documentation

## 2014-08-24 DIAGNOSIS — R000 Tachycardia, unspecified: Secondary | ICD-10-CM | POA: Insufficient documentation

## 2014-08-24 DIAGNOSIS — E079 Disorder of thyroid, unspecified: Secondary | ICD-10-CM | POA: Insufficient documentation

## 2014-08-24 DIAGNOSIS — Z79899 Other long term (current) drug therapy: Secondary | ICD-10-CM | POA: Insufficient documentation

## 2014-08-24 MED ORDER — IBUPROFEN 600 MG PO TABS: 600.0000 mg | ORAL_TABLET | Freq: Four times a day (QID) | ORAL | Status: AC | PRN

## 2014-08-24 MED ORDER — HYDROCODONE-ACETAMINOPHEN 5-325 MG PO TABS: 1.0000 | ORAL_TABLET | ORAL | Status: AC | PRN

## 2014-08-24 MED ORDER — DEXAMETHASONE SODIUM PHOSPHATE 10 MG/ML IJ SOLN
10.0000 mg | Freq: Once | INTRAMUSCULAR | Status: AC
  Administered 2014-08-24: 10 mg via INTRAMUSCULAR
  Filled 2014-08-24: qty 1

## 2014-08-24 MED ORDER — AMOXICILLIN 500 MG PO CAPS: 500.0000 mg | ORAL_CAPSULE | Freq: Three times a day (TID) | ORAL | Status: AC

## 2014-08-24 NOTE — ED Provider Notes (Signed)
CSN: [id number]     Arrival date & time 08/24/14  1033 History   First MD Initiated Contact with Patient 08/24/14 1042     Chief Complaint  Patient presents with  . Sore Throat     (Consider location/radiation/quality/duration/timing/severity/associated sxs/prior Treatment) HPI Christian Pitts is a 24 y.o. male presents to emergency department complaining of sore throat and right earache. Patient states his symptoms began approximately 2 weeks ago. He was seen here 9 days ago, he was diagnosed with strep peritonitis. He states that the strep test was ran at an urgent care but he never filled his prescriptions at that time. While in emergency department he was treated with penicillin IM, Decadron IM, and was discharged with pain medications. Patient states that his symptoms improved slightly, however so throat continued and has worsened in the last several days again. He reports swollen tonsils, difficulty and pain with swallowing, pain radiating to right ear. He is unsure if he has had any fevers. He is able to swallow. He denies any neck pain or stiffness, headache, rash, any other symptoms.  Past Medical History  Diagnosis Date  . Anxiety   . Weight decrease   . Thyroid disorder    Past Surgical History  Procedure Laterality Date  . No past surgeries     Family History  Problem Relation Age of Onset  . Thyroid disease Father   . Hypertension Father    History  Substance Use Topics  . Smoking status: Current Every Day Smoker  . Smokeless tobacco: Not on file  . Alcohol Use: Yes     Comment: occasionallty    Review of Systems  Constitutional: Negative for fever and chills.  HENT: Positive for ear pain, sore throat and trouble swallowing. Negative for congestion, dental problem and voice change.       Allergies  Review of patient's allergies indicates no known allergies.  Home Medications   Prior to Admission medications   Medication Sig Start Date End Date Taking?  Authorizing Provider  amoxicillin (AMOXIL) 500 MG capsule Take 1 capsule (500 mg total) by mouth 3 (three) times daily. 08/24/14   Ary Rudnick A Aigner Horseman, PA-C  atenolol (TENORMIN) 50 MG tablet Take 1 tablet (50 mg total) by mouth daily. 04/21/14   Clydia LlanoMutaz Elmahi, MD  HYDROcodone-acetaminophen (NORCO/VICODIN) 5-325 MG per tablet Take 1 tablet by mouth every 4 (four) hours as needed for moderate pain or severe pain. 08/24/14   Marcene Laskowski A Knox Cervi, PA-C  ibuprofen (ADVIL,MOTRIN) 600 MG tablet Take 1 tablet (600 mg total) by mouth every 6 (six) hours as needed. 08/24/14   Jabreel Chimento A Honora Searson, PA-C  methimazole (TAPAZOLE) 10 MG tablet Take 3 tablets (30 mg total) by mouth daily. 04/21/14   Mutaz Elmahi, MD   BP 149/81 mmHg  Pulse 106  Temp(Src) 98.1 F (36.7 C) (Oral)  Resp 20  SpO2 100% Physical Exam  Constitutional: He appears well-developed and well-nourished. No distress.  HENT:  Head: Normocephalic.  Right Ear: External ear normal.  Left Ear: External ear normal.  Nose: Nose normal.  Mouth/Throat: Oropharyngeal exudate present.  Cerumen impaction bilaterally. Tonsils are bilaterally enlarged, 3+, exudate is present bilaterally. Tonsils are equal in size, uvula is midline.  Eyes: Conjunctivae are normal.  Neck: Neck supple.  Cardiovascular: Regular rhythm and normal heart sounds.   Tachycardic  Pulmonary/Chest: Effort normal and breath sounds normal. No respiratory distress. He has no wheezes. He has no rales.  Lymphadenopathy:    He has cervical adenopathy.  Nursing  note and vitals reviewed.   ED Course  Procedures (including critical care time) Labs Review Labs Reviewed - No data to display  Imaging Review No results found.   EKG Interpretation None      MDM   Final diagnoses:  Pharyngitis    Patient is here with her current pharyngitis, treated with penicillin IM 9 days ago. Symptoms improved and now worsening again. On exam, bilaterally enlarged tonsils with exudate.  Uvula is midline, no evidence of peritonsillar retropharyngeal abscess. He is afebrile, slightly tachycardic, otherwise nontoxic appearing. Will administer Decadron IM in emergency department for inflammation and swelling, will start on amoxicillin by mouth for 10 days. Follow-up with primary care doctor.  Filed Vitals:   08/24/14 1046  BP: 149/81  Pulse: 106  Temp: 98.1 F (36.7 C)  Resp: [address] A Bellamy Judson, PA-C 08/24/14 1058  Geoffery Lyons, MD 08/24/14 [phone]

## 2014-08-24 NOTE — ED Notes (Signed)
Per EMS: Pt states he was dx w/ strep on Monday.  States that he didn't get his rx filled and still hurts.

## 2014-08-24 NOTE — Discharge Instructions (Signed)
Take ibuprofen and norco for pain. Amoxicillin as prescribed until all gone. Follow up with your doctor for recheck. Return if worsening.   Strep Throat Strep throat is an infection of the throat caused by a bacteria named Streptococcus pyogenes. Your health care provider may call the infection streptococcal "tonsillitis" or "pharyngitis" depending on whether there are signs of inflammation in the tonsils or back of the throat. Strep throat is most common in children aged 5-15 years during the cold months of the year, but it can occur in people of any age during any season. This infection is spread from person to person (contagious) through coughing, sneezing, or other close contact. SIGNS AND SYMPTOMS   Fever or chills.  Painful, swollen, red tonsils or throat.  Pain or difficulty when swallowing.  White or yellow spots on the tonsils or throat.  Swollen, tender lymph nodes or "glands" of the neck or under the jaw.  Red rash all over the body (rare). DIAGNOSIS  Many different infections can cause the same symptoms. A test must be done to confirm the diagnosis so the right treatment can be given. A "rapid strep test" can help your health care provider make the diagnosis in a few minutes. If this test is not available, a light swab of the infected area can be used for a throat culture test. If a throat culture test is done, results are usually available in a day or two. TREATMENT  Strep throat is treated with antibiotic medicine. HOME CARE INSTRUCTIONS   Gargle with 1 tsp of salt in 1 cup of warm water, 3-4 times per day or as needed for comfort.  Family members who also have a sore throat or fever should be tested for strep throat and treated with antibiotics if they have the strep infection.  Make sure everyone in your household washes their hands well.  Do not share food, drinking cups, or personal items that could cause the infection to spread to others.  You may need to eat a soft  food diet until your sore throat gets better.  Drink enough water and fluids to keep your urine clear or pale yellow. This will help prevent dehydration.  Get plenty of rest.  Stay home from school, day care, or work until you have been on antibiotics for 24 hours.  Take medicines only as directed by your health care provider.  Take your antibiotic medicine as directed by your health care provider. Finish it even if you start to feel better. SEEK MEDICAL CARE IF:   The glands in your neck continue to enlarge.  You develop a rash, cough, or earache.  You cough up green, yellow-brown, or bloody sputum.  You have pain or discomfort not controlled by medicines.  Your problems seem to be getting worse rather than better.  You have a fever. SEEK IMMEDIATE MEDICAL CARE IF:   You develop any new symptoms such as vomiting, severe headache, stiff or painful neck, chest pain, shortness of breath, or trouble swallowing.  You develop severe throat pain, drooling, or changes in your voice.  You develop swelling of the neck, or the skin on the neck becomes red and tender.  You develop signs of dehydration, such as fatigue, dry mouth, and decreased urination.  You become increasingly sleepy, or you cannot wake up completely. MAKE SURE YOU:  Understand these instructions.  Will watch your condition.  Will get help right away if you are not doing well or get worse. Document Released: 07/19/2000  Document Revised: 12/06/2013 Document Reviewed: 09/20/2010 [facility] Patient Information 2015 [employer] [place]. This information is not intended to replace advice given to you by your health care provider. Make sure you discuss any questions you have with your health care provider.

## 2014-08-24 NOTE — Progress Notes (Signed)
ED CM consulted by [facility] staff, Stacy to see pt when he inquired about [facility] self pay resources Spoke with pt who informed CM [place] for a court date but [place] already moved to [place] Confirms no pcp nor coverage. Was given [facility] resources.  Cm provided pt with community organizations that provide medical, dental or mental health services for [place] who would have trouble receiving care otherwise Individual insurance counseling assistance is available free of charge through [employer] [employer] ([employer]).   [place] resources included  [facility] residents who are uninsured and unable to pay for doctor visits or medicine may be eligible to receive care through one of [address] [place] Centers. [facility]'s two mobile clinics provide primary care to [facility] [place] residents. These [facility] mobile clinics provide physical exams, vision and hearing screenings, blood pressure checks, immunizations, basic medical care and health information, as well as breast exams and screening for diabetes and hypertension. For more information, call [phone]. [facility] [place] works to increase access to specialty care through a Personnel officervolunteer network of physicians who provide care to low-income, uninsured "safety net" patients in [place] and [place] at no cost   [facility]  [address], [address] [place], [place] [id number] P [phone] F [phone]

## 2014-08-25 ENCOUNTER — Emergency Department (HOSPITAL_COMMUNITY)
Admission: EM | Admit: 2014-08-25 | Discharge: 2014-08-25 | Disposition: A | Discharge status: Home / Self Care | Payer: Self-pay | Attending: Emergency Medicine | Admitting: Emergency Medicine

## 2014-08-25 ENCOUNTER — Encounter (HOSPITAL_COMMUNITY): Payer: Self-pay | Admitting: Nurse Practitioner

## 2014-08-25 DIAGNOSIS — J029 Acute pharyngitis, unspecified: Secondary | ICD-10-CM | POA: Insufficient documentation

## 2014-08-25 DIAGNOSIS — Z8659 Personal history of other mental and behavioral disorders: Secondary | ICD-10-CM | POA: Insufficient documentation

## 2014-08-25 DIAGNOSIS — Z79899 Other long term (current) drug therapy: Secondary | ICD-10-CM | POA: Insufficient documentation

## 2014-08-25 DIAGNOSIS — Z720 Tobacco use: Secondary | ICD-10-CM | POA: Insufficient documentation

## 2014-08-25 DIAGNOSIS — E079 Disorder of thyroid, unspecified: Secondary | ICD-10-CM | POA: Insufficient documentation

## 2014-08-25 DIAGNOSIS — H9209 Otalgia, unspecified ear: Secondary | ICD-10-CM | POA: Insufficient documentation

## 2014-08-25 MED ORDER — IBUPROFEN 200 MG PO TABS
600.0000 mg | ORAL_TABLET | Freq: Once | ORAL | Status: AC
  Administered 2014-08-25: 600 mg via ORAL
  Filled 2014-08-25: qty 3

## 2014-08-25 MED ORDER — AMOXICILLIN 250 MG PO CAPS
250.0000 mg | ORAL_CAPSULE | Freq: Once | ORAL | Status: AC
  Administered 2014-08-25: 250 mg via ORAL
  Filled 2014-08-25: qty 1

## 2014-08-25 NOTE — Discharge Instructions (Signed)
You have been given information on how to fill your prescriptions

## 2014-08-25 NOTE — ED Notes (Signed)
Bed: JW11WA18 Expected date:  Expected time:  Means of arrival:  Comments: EMS 24 yo male with laryngitis-seen in ED this AM for same-did not fill RX-states symptoms worse

## 2014-08-25 NOTE — ED Notes (Signed)
Pt seen earlier today and diagnosed with laryngitis, has not filled his Rx, says his symptom have not resolved.

## 2014-08-25 NOTE — ED Provider Notes (Signed)
CSN: [id number]     Arrival date & time 08/25/14  0110 History   First MD Initiated Contact with Patient 08/25/14 0119     No chief complaint on file.    (Consider location/radiation/quality/duration/timing/severity/associated sxs/prior Treatment) HPI Comments: Returns tonight asking for sample medications   The history is provided by the patient.    Past Medical History  Diagnosis Date  . Anxiety   . Weight decrease   . Thyroid disorder    Past Surgical History  Procedure Laterality Date  . No past surgeries     Family History  Problem Relation Age of Onset  . Thyroid disease Father   . Hypertension Father    History  Substance Use Topics  . Smoking status: Current Every Day Smoker  . Smokeless tobacco: Not on file  . Alcohol Use: Yes     Comment: occasionallty    Review of Systems  Constitutional: Negative for fever and chills.  HENT: Positive for ear pain and sore throat. Negative for trouble swallowing.   Respiratory: Negative for shortness of breath.   Cardiovascular: Negative for chest pain.  Gastrointestinal: Negative for nausea.      Allergies  Review of patient's allergies indicates no known allergies.  Home Medications   Prior to Admission medications   Medication Sig Start Date End Date Taking? Authorizing Provider  methimazole (TAPAZOLE) 10 MG tablet Take 3 tablets (30 mg total) by mouth daily. 04/21/14  Yes Clydia Llano, MD  naproxen sodium (ANAPROX) 220 MG tablet Take 220 mg by mouth 2 (two) times daily as needed (pain).   Yes Historical Provider, MD  amoxicillin (AMOXIL) 500 MG capsule Take 1 capsule (500 mg total) by mouth 3 (three) times daily. Patient not taking: Reported on 08/25/2014 08/24/14   Tatyana A Kirichenko, PA-C  atenolol (TENORMIN) 50 MG tablet Take 1 tablet (50 mg total) by mouth daily. Patient not taking: Reported on 08/25/2014 04/21/14   Clydia Llano, MD  HYDROcodone-acetaminophen (NORCO/VICODIN) 5-325 MG per tablet Take 1 tablet  by mouth every 4 (four) hours as needed for moderate pain or severe pain. 08/24/14   Tatyana A Kirichenko, PA-C  ibuprofen (ADVIL,MOTRIN) 600 MG tablet Take 1 tablet (600 mg total) by mouth every 6 (six) hours as needed. Patient not taking: Reported on 08/25/2014 08/24/14   Tatyana A Kirichenko, PA-C   BP 136/70 mmHg  Pulse 104  Temp(Src) 98.2 F (36.8 C) (Oral)  Resp 18  SpO2 100% Physical Exam  Constitutional: He is oriented to person, place, and time. He appears well-nourished.  HENT:  Head: Normocephalic.  Mouth/Throat: Uvula is midline. No uvula swelling. Oropharyngeal exudate present.  Eyes: Pupils are equal, round, and reactive to light.  Neck: Normal range of motion.  Cardiovascular: Normal rate.   Pulmonary/Chest: Effort normal.  Musculoskeletal: Normal range of motion.  Lymphadenopathy:    He has cervical adenopathy.  Neurological: He is alert and oriented to person, place, and time.  Skin: Skin is warm and dry.  Nursing note and vitals reviewed.   ED Course  Procedures (including critical care time) Labs Review Labs Reviewed - No data to display  Imaging Review No results found.   EKG Interpretation None     Patient is returning to his home in [place] in the morning I gave him coupons  Through [facility] to fill his prescriptions at minimal cost to him  MDM   Final diagnoses:  Pharyngitis        Arman Filter, NP 08/25/14 0151  Hanley SeamenJohn L Molpus, MD 08/25/14 [phone]

## 2016-02-28 IMAGING — CR DG CHEST 2V
2 series · 2 of 2 positions shown · non-contrast
Comparison: 03/03/2013

CLINICAL DATA: Panic attack.  Shortness of breath.  Dizziness.

EXAM:
CHEST  2 VIEW

[w chest pa]
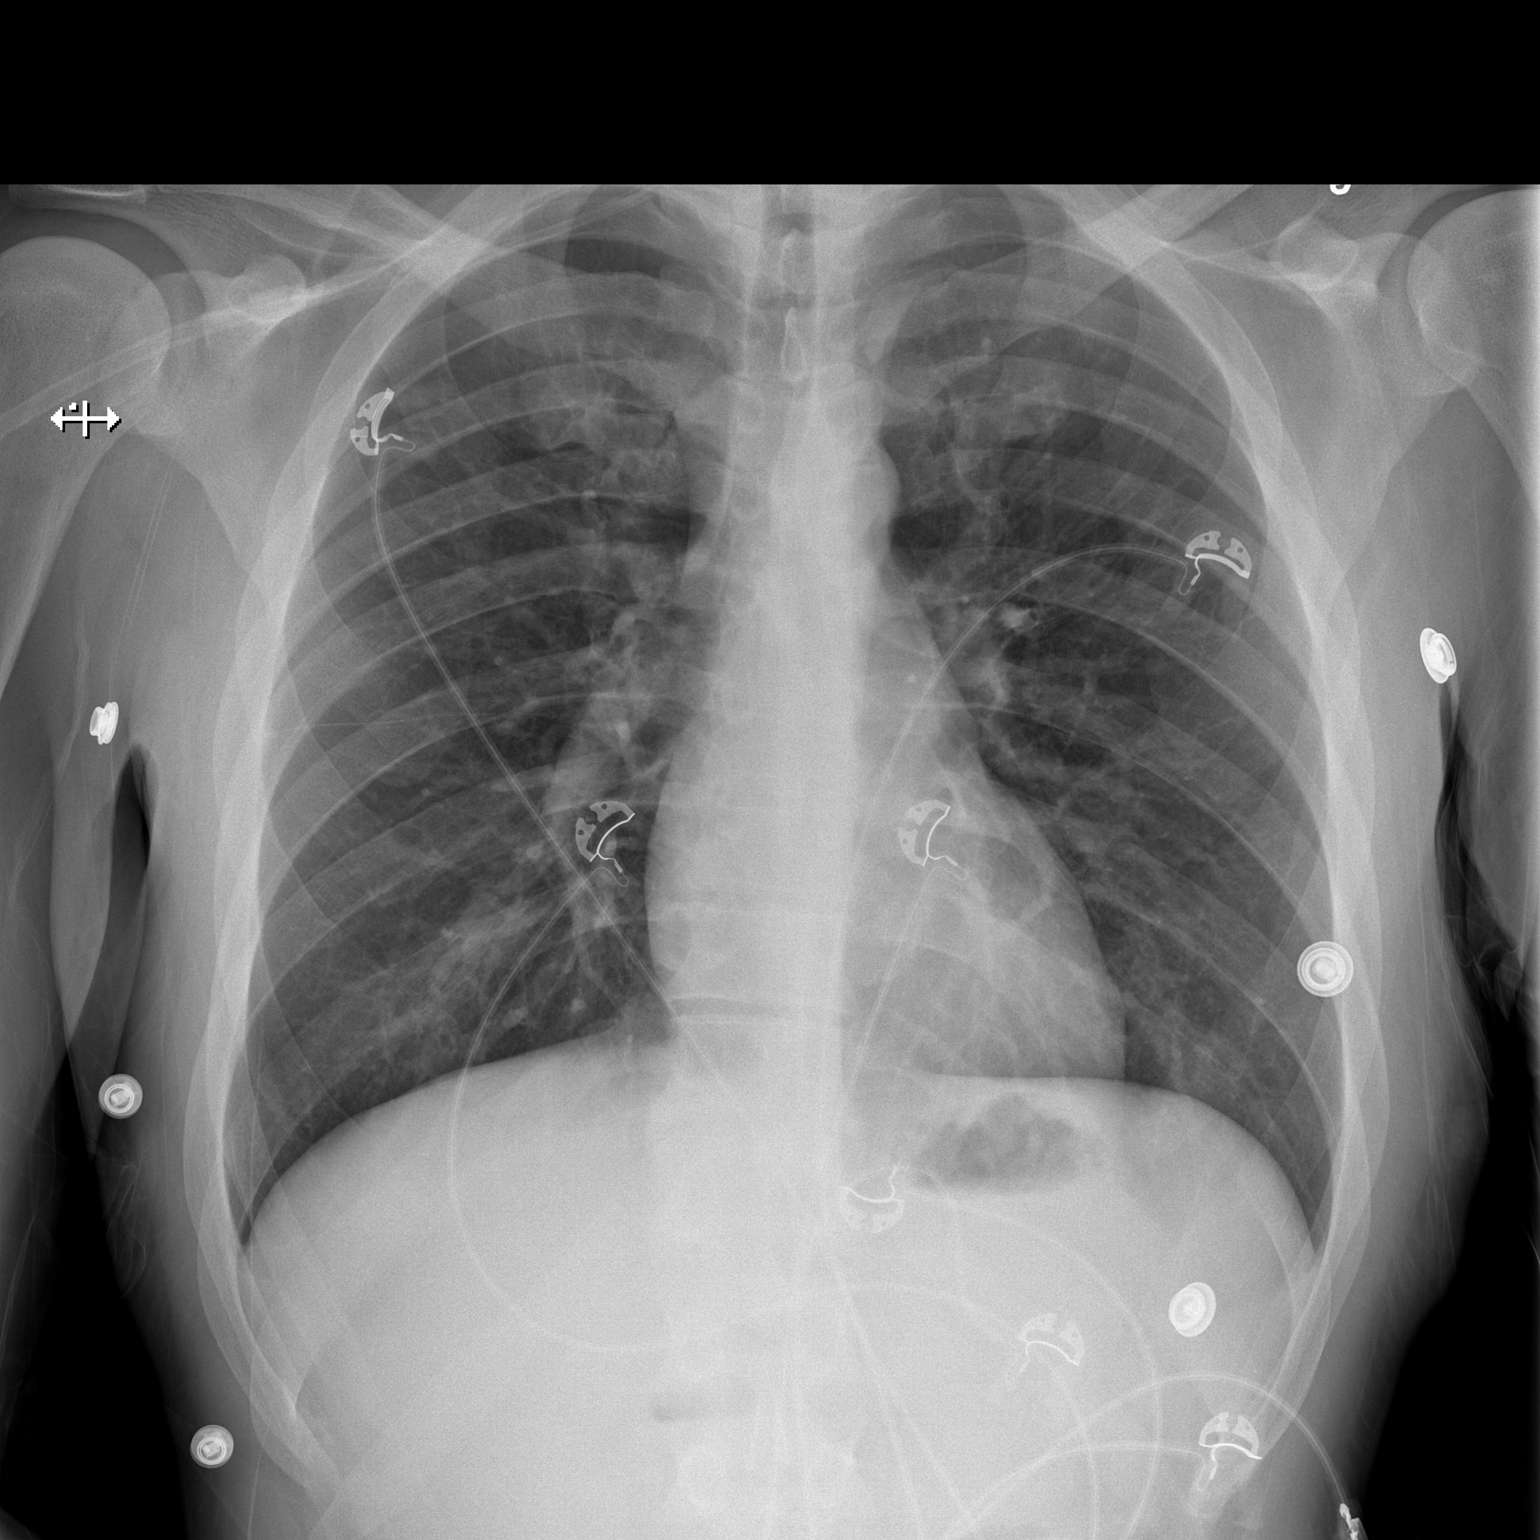

[w chest lat]
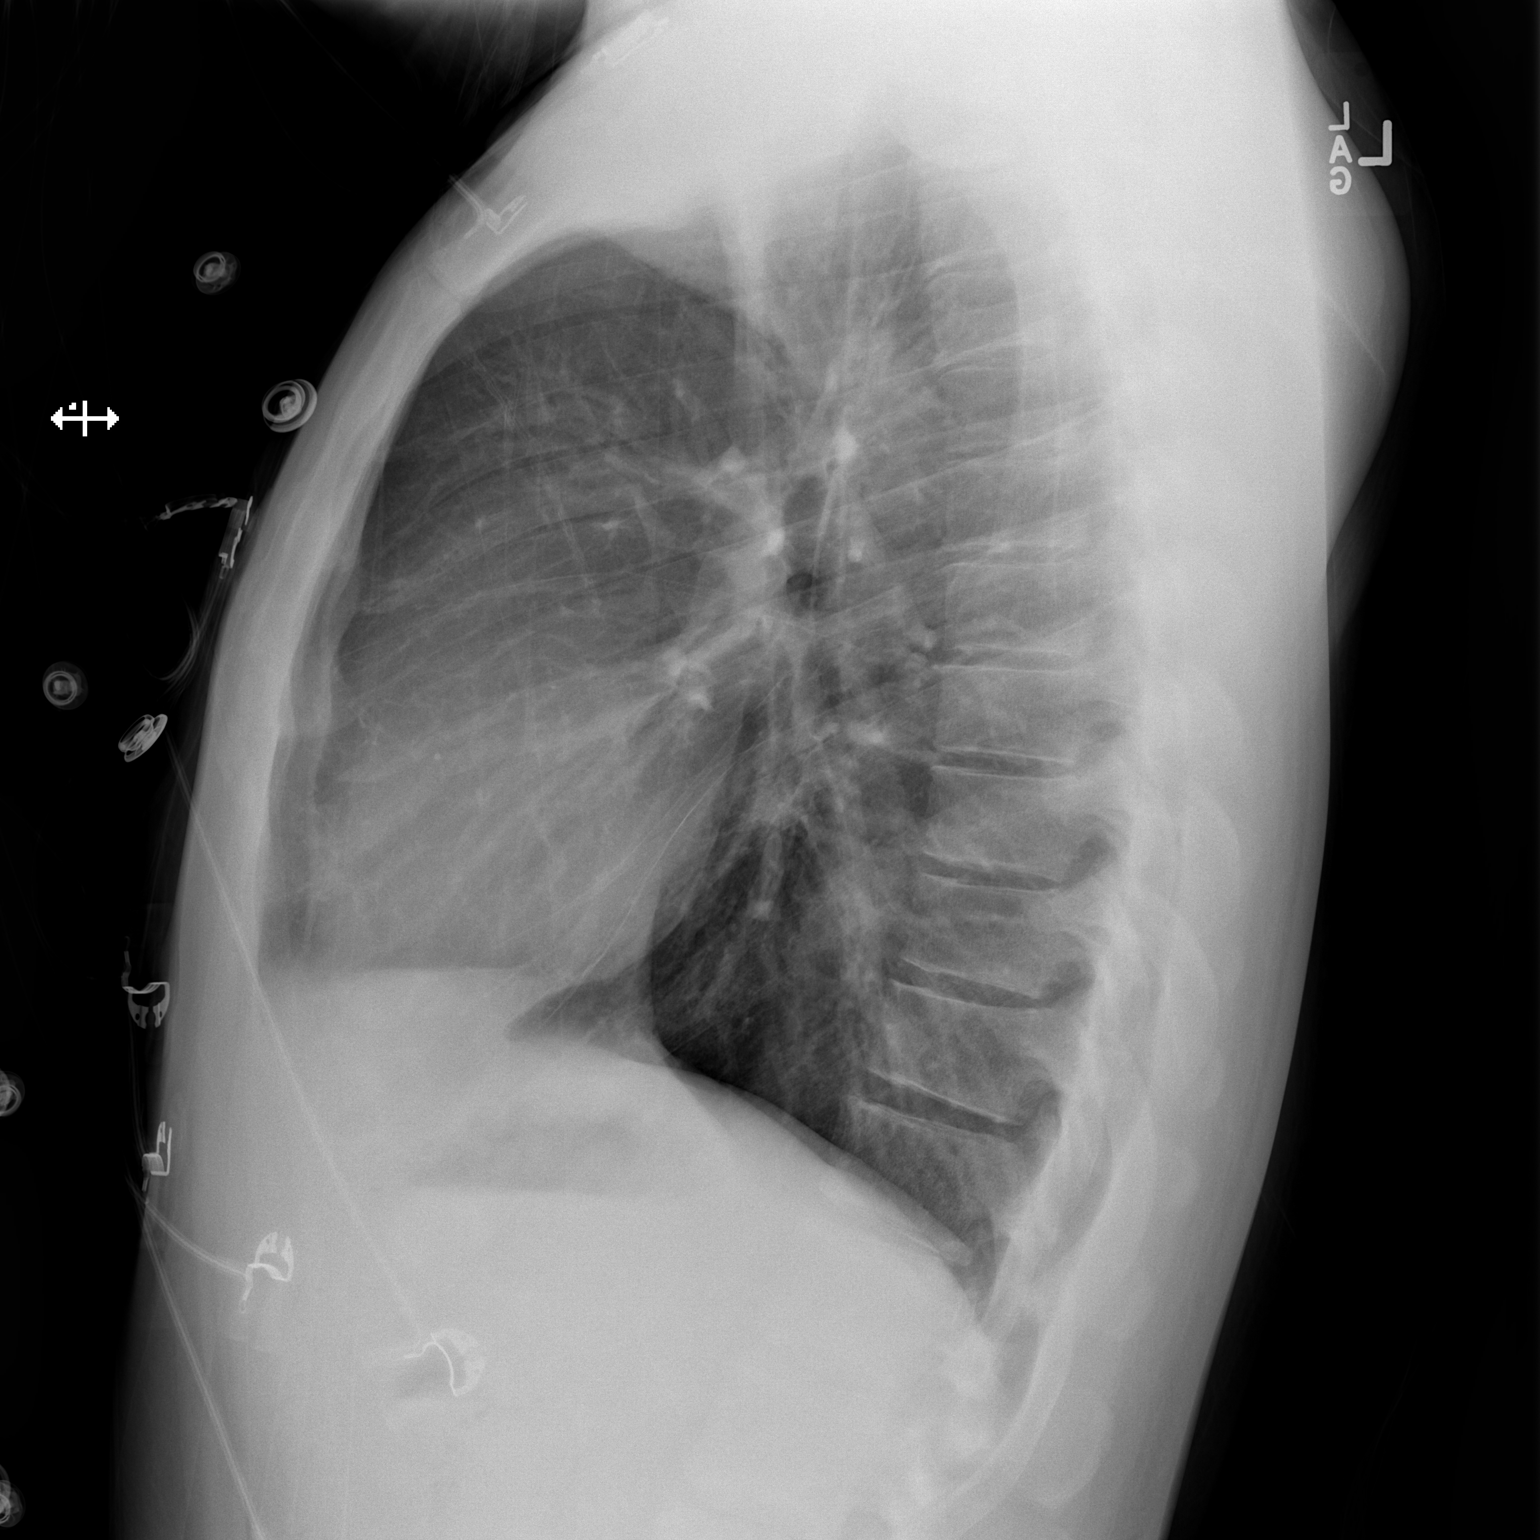

[2 of 2 positions shown; findings below may reference images not displayed]

FINDINGS: The heart size and mediastinal contours are within normal limits.
Both lungs are clear. The visualized skeletal structures are
unremarkable.
IMPRESSION: No active cardiopulmonary disease.

## 2017-07-21 NOTE — Telephone Encounter (Signed)
Pt called back stating he was still in a lot of pain. Pt is taking his medicine every 4 hour. Pt denies fever or n/v. Pt will probably go back to the ER.   Oda KiltsHeidi S Drema Eddington, LPN

## 2017-07-21 NOTE — Telephone Encounter (Signed)
Pt had Left nephrolithiasis 07/19/2017 [facility]  Pt called and asked if he can get Percocet for his pain since the  HYDROcodone-acetaminophen doesn't seem to work for him.    Post op appt scheduled on Thursday, July 24, 2017 03:15

## 2017-07-24 ENCOUNTER — Encounter: Attending: Urology
